# Patient Record
Sex: Female | Born: 1965 | Race: Black or African American | Hispanic: No | Marital: Single
Health system: Midwestern US, Academic
[De-identification: ages and names within clinical notes are randomized; demographics above are authoritative.]

---

## 2017-10-28 ENCOUNTER — Encounter: Admit: 2017-10-28 | Discharge: 2017-10-29

## 2017-11-12 ENCOUNTER — Encounter: Admit: 2017-11-12 | Discharge: 2017-11-12

## 2017-11-18 ENCOUNTER — Encounter: Admit: 2017-11-18 | Discharge: 2017-11-18

## 2017-11-24 ENCOUNTER — Encounter: Admit: 2017-11-24 | Discharge: 2017-11-24

## 2017-11-30 ENCOUNTER — Encounter: Admit: 2017-11-30 | Discharge: 2017-11-30

## 2017-12-02 ENCOUNTER — Encounter: Admit: 2017-12-02 | Discharge: 2017-12-02

## 2017-12-16 ENCOUNTER — Encounter: Admit: 2017-12-16 | Discharge: 2017-12-16

## 2017-12-16 ENCOUNTER — Ambulatory Visit: Admit: 2017-12-16 | Discharge: 2017-12-16 | Payer: Medicaid Other

## 2017-12-16 ENCOUNTER — Ambulatory Visit: Admit: 2017-12-16 | Discharge: 2017-12-17 | Payer: Medicaid Other

## 2017-12-16 DIAGNOSIS — R7989 Other specified abnormal findings of blood chemistry: ICD-10-CM

## 2017-12-16 DIAGNOSIS — K7460 Unspecified cirrhosis of liver: ICD-10-CM

## 2017-12-16 DIAGNOSIS — K760 Fatty (change of) liver, not elsewhere classified: Principal | ICD-10-CM

## 2017-12-16 DIAGNOSIS — R188 Other ascites: ICD-10-CM

## 2017-12-16 LAB — CBC AND DIFF
Lab: 0 % — ABNORMAL LOW (ref >60)
Lab: 0 10*3/uL (ref 0–0.20)
Lab: 0 10*3/uL (ref 0–0.45)
Lab: 0.3 10*3/uL (ref 0–0.80)
Lab: 2.5 M/UL — ABNORMAL LOW (ref 4.0–5.0)
Lab: 22 % — ABNORMAL LOW (ref 24–44)
Lab: 24 % — ABNORMAL LOW (ref 36–45)
Lab: 4.6 10*3/uL (ref 1.8–7.0)
Lab: 6.3 10*3/uL (ref 4.5–11.0)

## 2017-12-16 LAB — HEPATITIS B SURFACE AB

## 2017-12-16 LAB — HEPATITIS C ANTIBODY W REFLEX HCV PCR QUANT: Lab: NEGATIVE

## 2017-12-16 LAB — COMPREHENSIVE METABOLIC PANEL
Lab: 142 MMOL/L (ref 137–147)
Lab: 3.4 MMOL/L — ABNORMAL LOW (ref 3.5–5.1)

## 2017-12-16 LAB — LIPID PROFILE
Lab: 41 mg/dL (ref 7–11)
Lab: 46 mg/dL — ABNORMAL LOW (ref <200)
Lab: 54 mg/dL (ref <150)

## 2017-12-16 LAB — TRANSFERRIN: Lab: <75 mg/dL — ABNORMAL LOW (ref 185–336)

## 2017-12-16 LAB — FOLATE, SERUM: Lab: 21 ng/mL (ref >3.9)

## 2017-12-16 LAB — VITAMIN B12: Lab: 359 pg/mL — ABNORMAL HIGH (ref 180–914)

## 2017-12-16 LAB — ALPHA FETO PROTEIN (AFP): Lab: 1.9 ng/mL — ABNORMAL LOW (ref 0.0–15.0)

## 2017-12-16 LAB — IRON + BINDING CAPACITY + %SAT+ FERRITIN
Lab: 49 ug/dL — ABNORMAL LOW (ref >40)
Lab: 713 ng/mL — ABNORMAL HIGH (ref <100)

## 2017-12-16 LAB — HEPATITIS A IGG: Lab: POSITIVE

## 2017-12-16 LAB — PROTIME INR (PT): Lab: 1.7 — ABNORMAL HIGH (ref 0.8–1.2)

## 2017-12-16 LAB — HEPATITIS B SURFACE AG: Lab: NEGATIVE

## 2017-12-16 MED ORDER — RIFAXIMIN 550 MG PO TAB
550 mg | ORAL_TABLET | Freq: Two times a day (BID) | ORAL | 11 refills | Status: SS
Start: 2017-12-16 — End: 2017-12-17

## 2017-12-16 MED FILL — RIFAXIMIN 550 MG PO TAB: 550 mg | ORAL | 30 days supply | Qty: 60 | Fill #1 | Status: AC

## 2017-12-17 ENCOUNTER — Encounter: Admit: 2017-12-17 | Discharge: 2017-12-17

## 2017-12-17 ENCOUNTER — Emergency Department: Admit: 2017-12-17 | Discharge: 2017-12-17

## 2017-12-17 DIAGNOSIS — K760 Fatty (change of) liver, not elsewhere classified: ICD-10-CM

## 2017-12-17 DIAGNOSIS — E669 Obesity, unspecified: ICD-10-CM

## 2017-12-17 DIAGNOSIS — E8770 Fluid overload, unspecified: Secondary | ICD-10-CM

## 2017-12-17 DIAGNOSIS — E43 Unspecified severe protein-calorie malnutrition: Secondary | ICD-10-CM

## 2017-12-17 DIAGNOSIS — K7460 Unspecified cirrhosis of liver: ICD-10-CM

## 2017-12-17 DIAGNOSIS — K7290 Hepatic failure, unspecified without coma: ICD-10-CM

## 2017-12-17 DIAGNOSIS — M1990 Unspecified osteoarthritis, unspecified site: Principal | ICD-10-CM

## 2017-12-17 DIAGNOSIS — R188 Other ascites: ICD-10-CM

## 2017-12-17 DIAGNOSIS — R601 Generalized edema: ICD-10-CM

## 2017-12-17 DIAGNOSIS — E46 Unspecified protein-calorie malnutrition: ICD-10-CM

## 2017-12-17 DIAGNOSIS — R768 Other specified abnormal immunological findings in serum: ICD-10-CM

## 2017-12-17 LAB — ANTI-NUCLEAR ANTIBODY(ANA)

## 2017-12-17 LAB — PROTIME INR (PT): Lab: 1.7 — ABNORMAL HIGH (ref 0.8–1.2)

## 2017-12-17 LAB — URINALYSIS DIPSTICK
Lab: NEGATIVE 10*3/uL (ref 3–12)
Lab: NEGATIVE MMOL/L (ref 21–30)

## 2017-12-17 LAB — ANTI-NUCLEAR AB(ANA)-QUANT

## 2017-12-17 LAB — CBC AND DIFF
Lab: 0 10*3/uL (ref 0–0.20)
Lab: 0 10*3/uL (ref 0–0.45)
Lab: 5.6 10*3/uL (ref 4.5–11.0)

## 2017-12-17 LAB — COMPREHENSIVE METABOLIC PANEL
Lab: 141 MMOL/L — ABNORMAL LOW (ref 137–147)
Lab: 20 mL/min — ABNORMAL LOW (ref >60)
Lab: 25 mL/min — ABNORMAL LOW (ref >60)

## 2017-12-17 LAB — HEMOGLOBIN A1C: Lab: 4.1 % — ABNORMAL LOW (ref 4.0–6.0)

## 2017-12-17 LAB — 25-OH VITAMIN D (D2 + D3): Lab: 16 ng/mL — ABNORMAL LOW (ref 30–80)

## 2017-12-17 LAB — POC GLUCOSE
Lab: 59 mg/dL — ABNORMAL LOW (ref 70–100)
Lab: 63 mg/dL — ABNORMAL LOW (ref 70–100)
Lab: 57 mg/dL — ABNORMAL LOW (ref 70–100)
Lab: 63 mg/dL — ABNORMAL LOW (ref 70–100)

## 2017-12-17 LAB — MAGNESIUM: Lab: 1.3 mg/dL — ABNORMAL LOW (ref 1.6–2.6)

## 2017-12-17 LAB — URINALYSIS, MICROSCOPIC

## 2017-12-17 LAB — ALPHA-1-ANTITRYPSIN, TOTAL: Lab: 92 — ABNORMAL LOW

## 2017-12-17 LAB — TSH WITH FREE T4 REFLEX: Lab: 7.1 uU/mL — ABNORMAL HIGH (ref 0.35–5.00)

## 2017-12-17 LAB — LIPASE: Lab: 10 U/L — ABNORMAL LOW (ref 11–82)

## 2017-12-17 LAB — FREE T4-FREE THYROXINE: Lab: 0.8 ng/dL (ref 0.6–1.6)

## 2017-12-17 LAB — ANTI-SMOOTH MUSCLE AB: Lab: <20 {titer} (ref <20)

## 2017-12-17 LAB — POC LACTATE: Lab: 1.6 MMOL/L (ref 0.5–2.0)

## 2017-12-17 LAB — ANTI-MITOCHONDRIAL ANTIBODY: Lab: <20 {titer} — ABNORMAL LOW (ref <20)

## 2017-12-17 MED ORDER — PROMETHAZINE 25 MG/ML IJ SOLN
12.5 mg | Freq: Once | INTRAVENOUS | 0 refills | Status: CP
Start: 2017-12-17 — End: ?
  Administered 2017-12-18: 05:00:00 12.5 mg via INTRAVENOUS

## 2017-12-17 MED ORDER — CEFTRIAXONE INJ 1GM IVP
1 g | Freq: Once | INTRAVENOUS | 0 refills | Status: CP
Start: 2017-12-17 — End: ?
  Administered 2017-12-17: 22:00:00 1 g via INTRAVENOUS

## 2017-12-17 MED ORDER — PROMETHAZINE 25 MG/ML IJ SOLN
12.5 mg | Freq: Once | INTRAVENOUS | 0 refills | Status: CP
Start: 2017-12-17 — End: ?
  Administered 2017-12-17: 20:00:00 12.5 mg via INTRAVENOUS

## 2017-12-17 MED ORDER — DICLOFENAC SODIUM 1 % TP GEL
4 g | Freq: Two times a day (BID) | TOPICAL | 0 refills | Status: DC
Start: 2017-12-17 — End: 2017-12-31
  Administered 2017-12-18 – 2017-12-29 (×4): 4 g via TOPICAL

## 2017-12-17 MED ORDER — DEXTROSE 25 % IN WATER (D25W) IV SYRG
10 mL | Freq: Once | INTRAVENOUS | 0 refills | Status: DC
Start: 2017-12-17 — End: 2017-12-17

## 2017-12-17 MED ORDER — MAGNESIUM OXIDE 400 MG (241.3 MG MAGNESIUM) PO TAB
800 mg | Freq: Two times a day (BID) | ORAL | 0 refills | Status: DC
Start: 2017-12-17 — End: 2017-12-29
  Administered 2017-12-18 – 2017-12-29 (×21): 800 mg via ORAL

## 2017-12-17 MED ORDER — CHOLECALCIFEROL (VITAMIN D3) 1,000 UNIT PO TAB
2000 [IU] | Freq: Every day | ORAL | 0 refills | Status: DC
Start: 2017-12-17 — End: 2017-12-22
  Administered 2017-12-17 – 2017-12-21 (×5): 2000 [IU] via ORAL

## 2017-12-17 MED ORDER — MIDODRINE 5 MG PO TAB
2.5 mg | Freq: Two times a day (BID) | ORAL | 0 refills | Status: DC
Start: 2017-12-17 — End: 2017-12-23
  Administered 2017-12-17 – 2017-12-23 (×10): 2.5 mg via ORAL

## 2017-12-17 MED ORDER — LIDOCAINE 5 % TP PTMD
1 | TOPICAL | 0 refills | Status: DC
Start: 2017-12-17 — End: 2017-12-31
  Administered 2017-12-18 – 2017-12-21 (×4): 1 via TOPICAL

## 2017-12-17 MED ORDER — MAGNESIUM SULFATE IN WATER 4 GRAM/50 ML (8 %) IV PGBK
4 g | Freq: Once | INTRAVENOUS | 0 refills | Status: CP
Start: 2017-12-17 — End: ?
  Administered 2017-12-17: 19:00:00 4 g via INTRAVENOUS

## 2017-12-17 MED ORDER — PROMETHAZINE 25 MG PO TAB
25 mg | ORAL | 0 refills | Status: DC | PRN
Start: 2017-12-17 — End: 2017-12-18
  Administered 2017-12-17 – 2017-12-18 (×3): 25 mg via ORAL

## 2017-12-17 MED ORDER — ONDANSETRON 4 MG PO TBDI
4 mg | ORAL | 0 refills | Status: DC | PRN
Start: 2017-12-17 — End: 2017-12-17

## 2017-12-17 MED ORDER — TRIAMCINOLONE ACETONIDE 0.1 % TP CREA
Freq: Three times a day (TID) | TOPICAL | 0 refills | Status: DC
Start: 2017-12-17 — End: 2017-12-31
  Administered 2017-12-18 – 2017-12-29 (×4): via TOPICAL

## 2017-12-17 MED ORDER — PANTOPRAZOLE 40 MG PO TBEC
40 mg | Freq: Every day | ORAL | 0 refills | Status: DC
Start: 2017-12-17 — End: 2017-12-31
  Administered 2017-12-18 – 2017-12-31 (×13): 40 mg via ORAL

## 2017-12-17 MED ORDER — LACTULOSE 10 GRAM/15 ML PO SOLN
20 g | Freq: Every day | ORAL | 0 refills | Status: CN
Start: 2017-12-17 — End: ?

## 2017-12-17 MED ORDER — MULTIVITAMIN PO TAB
1 | Freq: Every day | ORAL | 0 refills | Status: DC
Start: 2017-12-17 — End: 2017-12-21
  Administered 2017-12-17 – 2017-12-20 (×3): 1 via ORAL

## 2017-12-17 MED ORDER — RIFAXIMIN 550 MG PO TAB
550 mg | Freq: Two times a day (BID) | ORAL | 0 refills | Status: DC
Start: 2017-12-17 — End: 2017-12-20
  Administered 2017-12-18 – 2017-12-20 (×5): 550 mg via ORAL

## 2017-12-17 MED ORDER — ALBUMIN, HUMAN 25 % IV SOLP
25 g | INTRAVENOUS | 0 refills | Status: AC
Start: 2017-12-17 — End: ?
  Administered 2017-12-17 – 2017-12-18 (×3): 25 g via INTRAVENOUS

## 2017-12-17 MED ORDER — VITAMIN B COMPLEX PO TAB
1 | Freq: Every day | ORAL | 0 refills | Status: DC
Start: 2017-12-17 — End: 2017-12-25
  Administered 2017-12-17 – 2017-12-25 (×7): 1 via ORAL

## 2017-12-17 MED ORDER — NYSTATIN 100,000 UNIT/GRAM TP POWD
Freq: Two times a day (BID) | TOPICAL | 0 refills | Status: DC
Start: 2017-12-17 — End: 2017-12-31
  Administered 2017-12-18 – 2017-12-28 (×5): via TOPICAL

## 2017-12-18 ENCOUNTER — Encounter: Admit: 2017-12-18 | Discharge: 2017-12-18

## 2017-12-18 DIAGNOSIS — K7460 Unspecified cirrhosis of liver: Principal | ICD-10-CM

## 2017-12-18 DIAGNOSIS — K7290 Hepatic failure, unspecified without coma: ICD-10-CM

## 2017-12-18 DIAGNOSIS — E43 Unspecified severe protein-calorie malnutrition: ICD-10-CM

## 2017-12-18 LAB — PERITONEAL FLUID TOTAL PROTEIN

## 2017-12-18 LAB — CBC AND DIFF: Lab: 3.9 K/UL — ABNORMAL LOW (ref 4.5–11.0)

## 2017-12-18 LAB — ELECTROPHORESIS-SERUM PROTEIN
Lab: 18 % — ABNORMAL HIGH (ref 9–17)
Lab: 32 % — ABNORMAL LOW (ref 48–68)
Lab: 4.1 % (ref 2–6)
Lab: 5.8 g/dL — ABNORMAL LOW (ref 6.0–8.0)
Lab: 7.7 % (ref 5–15)

## 2017-12-18 LAB — COMPREHENSIVE METABOLIC PANEL: Lab: 143 MMOL/L — ABNORMAL LOW (ref 137–147)

## 2017-12-18 LAB — CBC
Lab: 2.3 M/UL — ABNORMAL LOW (ref 4.0–5.0)
Lab: 22 % — ABNORMAL LOW (ref 36–45)
Lab: 32 pg (ref 26–34)
Lab: 34 g/dL (ref 32.0–36.0)
Lab: 4.9 10*3/uL (ref 4.5–11.0)
Lab: 7.7 g/dL — ABNORMAL LOW (ref 12.0–15.0)
Lab: 94 FL (ref 80–100)

## 2017-12-18 LAB — PERITONEAL FLUID ALBUMIN

## 2017-12-18 LAB — KAPPA/LAMBDA FREE LIGHT CHAINS
Lab: 0.9 % — ABNORMAL HIGH (ref 0.26–1.65)
Lab: 13 mg/dL — ABNORMAL HIGH (ref 0.33–1.94)
Lab: 15 mg/dL — ABNORMAL HIGH (ref 0.57–2.63)
Lab: 14 mg/dL — ABNORMAL HIGH (ref 0.33–1.94)
Lab: 16 mg/dL — ABNORMAL HIGH (ref 0.57–2.63)

## 2017-12-18 LAB — MYELOPEROXIDASE AB

## 2017-12-18 LAB — PHOSPHORUS: Lab: 4 mg/dL (ref 2.0–4.5)

## 2017-12-18 LAB — C3 COMPLEMENT 3
Lab: 37 mg/dL — ABNORMAL LOW (ref 88–200)
Lab: 40 mg/dL — ABNORMAL LOW (ref 88–200)

## 2017-12-18 LAB — IMMUNOFIXATION, SERUM (IFES)

## 2017-12-18 LAB — UREA NITROGEN-URINE RANDOM: Lab: 592 mg/dL — ABNORMAL LOW (ref 40–50)

## 2017-12-18 LAB — OSMOLALITY: Lab: 309 mosm/kg — ABNORMAL HIGH (ref 280–307)

## 2017-12-18 LAB — CREATININE-URINE RANDOM: Lab: 155 mg/dL

## 2017-12-18 LAB — AMMONIA: Lab: 64 umol/L — ABNORMAL HIGH (ref 9–35)

## 2017-12-18 LAB — ANTI-DNA DOUBLE STRAND: Lab: <10 {titer} (ref <10)

## 2017-12-18 LAB — C4 COMPLEMENT 4
Lab: 15 mg/dL (ref 10–49)
Lab: 19 mg/dL (ref 10–49)

## 2017-12-18 LAB — POC GLUCOSE
Lab: 67 mg/dL — ABNORMAL LOW (ref 70–100)
Lab: 66 mg/dL — ABNORMAL LOW (ref 70–100)
Lab: 66 mg/dL — ABNORMAL LOW (ref 70–100)
Lab: 53 mg/dL — ABNORMAL LOW (ref 70–100)
Lab: 62 mg/dL — ABNORMAL LOW (ref 70–100)
Lab: 79 mg/dL (ref 70–100)
Lab: 80 mg/dL (ref 70–100)
Lab: 60 mg/dL — ABNORMAL LOW (ref 70–100)

## 2017-12-18 LAB — PROTEIN/CR RATIO,UR RAN
Lab: 40 mg/dL
Lab: 0.3

## 2017-12-18 LAB — HEMOGLOBIN & HEMATOCRIT
Lab: 17 % — ABNORMAL LOW (ref 36–45)
Lab: 5.8 g/dL — CL (ref 12.0–15.0)

## 2017-12-18 LAB — SODIUM-URINE RANDOM: Lab: 29 MMOL/L

## 2017-12-18 LAB — MAGNESIUM: Lab: 1.8 mg/dL — ABNORMAL LOW (ref 1.6–2.6)

## 2017-12-18 LAB — PROTIME INR (PT): Lab: 1.8 MMOL/L — ABNORMAL HIGH (ref 0.8–1.2)

## 2017-12-18 LAB — OSMOLALITY-URINE RANDOM: Lab: 402 mosm/kg (ref 50–1400)

## 2017-12-18 LAB — PROTEINASE 3 ANTIBODIES

## 2017-12-18 MED ORDER — ALBUMIN, HUMAN 25 % IV SOLP
0 refills | Status: CP
Start: 2017-12-18 — End: ?
  Administered 2017-12-18: 19:00:00 12.5 g via INTRAVENOUS

## 2017-12-18 MED ORDER — ZINC SULFATE 220 (50) MG PO CAP
220 mg | Freq: Every day | ORAL | 0 refills | Status: DC
Start: 2017-12-18 — End: 2017-12-25
  Administered 2017-12-19 – 2017-12-25 (×7): 220 mg via ORAL

## 2017-12-18 MED ORDER — ASCORBIC ACID (VITAMIN C) 500 MG PO TAB
500 mg | Freq: Every day | ORAL | 0 refills | Status: DC
Start: 2017-12-18 — End: 2017-12-21
  Administered 2017-12-19: 14:00:00 500 mg via ORAL

## 2017-12-18 MED ORDER — MULTIVIT-IRON-FA-CALCIUM-MINS 9 MG IRON-400 MCG PO TAB
1 | Freq: Two times a day (BID) | ORAL | 0 refills | Status: DC
Start: 2017-12-18 — End: 2017-12-31
  Administered 2017-12-19 – 2017-12-31 (×24): 1 via ORAL

## 2017-12-18 MED ORDER — VITS A AND D-WHITE PET-LANOLIN TP OINT
TOPICAL | 0 refills | Status: DC | PRN
Start: 2017-12-18 — End: 2017-12-31

## 2017-12-18 MED ORDER — FUROSEMIDE 200 MG IN D5W 100 ML IV DRIP
10 mg/h | INTRAVENOUS | 0 refills | Status: DC
Start: 2017-12-18 — End: 2017-12-23
  Administered 2017-12-18 – 2017-12-23 (×14): 10 mg/h via INTRAVENOUS

## 2017-12-18 MED ORDER — MAGNESIUM SULFATE IN D5W 1 GRAM/100 ML IV PGBK
1 g | INTRAVENOUS | 0 refills | Status: AC
Start: 2017-12-18 — End: ?

## 2017-12-18 MED ORDER — POTASSIUM CHLORIDE 20 MEQ PO TBTQ
40 meq | Freq: Once | ORAL | 0 refills | Status: CP
Start: 2017-12-18 — End: ?
  Administered 2017-12-19: 02:00:00 40 meq via ORAL

## 2017-12-18 MED ORDER — FUROSEMIDE 10 MG/ML IJ SOLN
20 mg | Freq: Once | INTRAVENOUS | 0 refills | Status: AC
Start: 2017-12-18 — End: ?

## 2017-12-18 MED ORDER — PROMETHAZINE 25 MG/ML IJ SOLN
12.5 mg | INTRAVENOUS | 0 refills | Status: DC | PRN
Start: 2017-12-18 — End: 2017-12-24
  Administered 2017-12-19 – 2017-12-24 (×20): 12.5 mg via INTRAVENOUS

## 2017-12-18 MED ORDER — DEXTROSE 10 % IN WATER (D10W) 10 % IV SOLP
500 mL | Freq: Once | INTRAVENOUS | 0 refills | Status: CP
Start: 2017-12-18 — End: ?
  Administered 2017-12-19: 02:00:00 500 mL via INTRAVENOUS

## 2017-12-18 MED ORDER — MULTIVIT-IRON-FA-CALCIUM-MINS 9 MG IRON-400 MCG PO TAB
1 | Freq: Every day | ORAL | 0 refills | Status: DC
Start: 2017-12-18 — End: 2017-12-18

## 2017-12-18 MED ADMIN — DEXTROSE 50 % IN WATER (D50W) IV SOLP [86270]: 25 mL | INTRAVENOUS | @ 12:00:00 | Stop: 2017-12-18 | NDC 00409664802

## 2017-12-19 LAB — RETICULOCYTE COUNT
Lab: 1.2 %
Lab: 2 % (ref 0.5–2.0)
Lab: 55 10*3/uL (ref 30–94)

## 2017-12-19 LAB — CULTURE-URINE W/SENSITIVITY: Lab: <10

## 2017-12-19 LAB — POC GLUCOSE
Lab: 82 mg/dL (ref 70–100)
Lab: 78 mg/dL (ref 70–100)
Lab: 102 mg/dL — ABNORMAL HIGH (ref 70–100)
Lab: 91 mg/dL (ref 70–100)

## 2017-12-19 LAB — MAGNESIUM: Lab: 1.6 mg/dL — ABNORMAL HIGH (ref >60)

## 2017-12-19 LAB — PHOSPHORUS: Lab: 3.7 mg/dL — ABNORMAL LOW (ref >60)

## 2017-12-19 LAB — PROTIME INR (PT): Lab: 1.8 M/UL — ABNORMAL HIGH (ref 0.8–1.2)

## 2017-12-19 LAB — COMPREHENSIVE METABOLIC PANEL: Lab: 143 MMOL/L — ABNORMAL LOW (ref 137–147)

## 2017-12-19 LAB — CBC AND DIFF
Lab: 2.7 M/UL — ABNORMAL LOW (ref >60)
Lab: 4.8 K/UL — ABNORMAL HIGH (ref >60)

## 2017-12-19 LAB — GRAM STAIN

## 2017-12-19 MED ORDER — ALBUMIN, HUMAN 25 % IV SOLP
25 g | INTRAVENOUS | 0 refills | Status: DC
Start: 2017-12-19 — End: 2017-12-21
  Administered 2017-12-19 – 2017-12-21 (×9): 25 g via INTRAVENOUS

## 2017-12-20 ENCOUNTER — Encounter: Admit: 2017-12-20 | Discharge: 2017-12-20

## 2017-12-20 LAB — BASIC METABOLIC PANEL
Lab: 111 MMOL/L — ABNORMAL HIGH (ref 98–110)
Lab: 143 MMOL/L (ref 137–147)
Lab: 2.3 mg/dL — ABNORMAL HIGH (ref 0.4–1.00)
Lab: 22 mL/min — ABNORMAL LOW (ref >60)
Lab: 23 MMOL/L (ref 21–30)
Lab: 27 mL/min — ABNORMAL LOW (ref >60)
Lab: 27 mg/dL — ABNORMAL HIGH (ref 7–25)
Lab: 3.3 MMOL/L — ABNORMAL LOW (ref 3.5–5.1)
Lab: 7.4 mg/dL — ABNORMAL LOW (ref 8.5–10.6)
Lab: 97 mg/dL (ref 70–100)
Lab: 9 (ref 3–12)

## 2017-12-20 LAB — URINALYSIS DIPSTICK
Lab: NEGATIVE
Lab: NEGATIVE
Lab: NEGATIVE
Lab: NEGATIVE
Lab: NEGATIVE
Lab: NEGATIVE

## 2017-12-20 LAB — URINALYSIS, MICROSCOPIC

## 2017-12-20 LAB — POC GLUCOSE
Lab: 76 mg/dL (ref 70–100)
Lab: 86 mg/dL (ref 70–100)
Lab: 56 mg/dL — ABNORMAL LOW (ref 70–100)
Lab: 45 mg/dL — CL (ref 70–100)
Lab: 65 mg/dL — ABNORMAL LOW (ref 70–100)
Lab: 84 mg/dL (ref 70–100)
Lab: 93 mg/dL (ref 70–100)
Lab: 93 mg/dL (ref 70–100)
Lab: 75 mg/dL (ref 70–100)

## 2017-12-20 LAB — PROTIME INR (PT): Lab: 1.9 g/dL — ABNORMAL HIGH (ref 0.8–1.2)

## 2017-12-20 LAB — VITAMIN A

## 2017-12-20 LAB — CBC AND DIFF
Lab: 2.6 M/UL — ABNORMAL LOW (ref 4.0–5.0)
Lab: 3.8 K/UL — ABNORMAL LOW (ref 4.5–11.0)

## 2017-12-20 LAB — MAGNESIUM: Lab: 1.6 mg/dL — ABNORMAL LOW (ref >60)

## 2017-12-20 LAB — PHOSPHORUS: Lab: 3.3 mg/dL — ABNORMAL LOW (ref 2.0–4.5)

## 2017-12-20 LAB — COMPREHENSIVE METABOLIC PANEL: Lab: 144 MMOL/L — ABNORMAL LOW (ref >60)

## 2017-12-20 MED ORDER — POTASSIUM CHLORIDE 20 MEQ PO TBTQ
40 meq | Freq: Once | ORAL | 0 refills | Status: AC
Start: 2017-12-20 — End: ?

## 2017-12-20 MED ORDER — PHYTONADIONE IVPB
10 mg | Freq: Three times a day (TID) | INTRAVENOUS | 0 refills | Status: DC
Start: 2017-12-20 — End: 2017-12-20

## 2017-12-20 MED ORDER — SODIUM CHLORIDE 0.9 % FLUSH
5-10 mL | Freq: Three times a day (TID) | INTRAVENOUS | 0 refills | Status: DC
Start: 2017-12-20 — End: 2017-12-31

## 2017-12-20 MED ORDER — MAGNESIUM SULFATE IN D5W 1 GRAM/100 ML IV PGBK
1 g | INTRAVENOUS | 0 refills | Status: CP
Start: 2017-12-20 — End: ?
  Administered 2017-12-21 (×2): 1 g via INTRAVENOUS

## 2017-12-20 MED ORDER — PHYTONADIONE IVPB
10 mg | Freq: Every day | INTRAVENOUS | 0 refills | Status: CP
Start: 2017-12-20 — End: ?
  Administered 2017-12-20 – 2017-12-22 (×6): 10 mg via INTRAVENOUS

## 2017-12-20 MED ORDER — POTASSIUM CHLORIDE IN WATER 10 MEQ/50 ML IV PGBK
10 meq | INTRAVENOUS | 0 refills | Status: DC
Start: 2017-12-20 — End: 2017-12-20

## 2017-12-20 MED ORDER — POTASSIUM CHLORIDE 20 MEQ PO TBTQ
40 meq | Freq: Once | ORAL | 0 refills | Status: CP
Start: 2017-12-20 — End: ?
  Administered 2017-12-20: 15:00:00 40 meq via ORAL

## 2017-12-20 MED ORDER — MAGNESIUM SULFATE IN D5W 1 GRAM/100 ML IV PGBK
1 g | INTRAVENOUS | 0 refills | Status: AC
Start: 2017-12-20 — End: ?

## 2017-12-20 MED ORDER — VITAMIN A 10,000 UNIT PO CAP
10000 [IU] | Freq: Every day | ORAL | 0 refills | Status: DC
Start: 2017-12-20 — End: 2017-12-25
  Administered 2017-12-20 – 2017-12-25 (×5): 10000 [IU] via ORAL

## 2017-12-20 MED ORDER — POTASSIUM CHLORIDE IN WATER 10 MEQ/50 ML IV PGBK
10 meq | INTRAVENOUS | 0 refills | Status: CP
Start: 2017-12-20 — End: ?
  Administered 2017-12-20 – 2017-12-21 (×4): 10 meq via INTRAVENOUS

## 2017-12-20 MED ORDER — DEXTROSE 10 % IN WATER (D10W) 10 % IV SOLP
INTRAVENOUS | 0 refills | Status: DC
Start: 2017-12-20 — End: 2017-12-22
  Administered 2017-12-20 – 2017-12-21 (×2): 1000.000 mL via INTRAVENOUS

## 2017-12-20 MED ADMIN — DEXTROSE 50 % IN WATER (D50W) IV SYRG [2365]: 25 mL | INTRAVENOUS | @ 16:00:00 | Stop: 2017-12-20 | NDC 00409751716

## 2017-12-21 ENCOUNTER — Encounter: Admit: 2017-12-21 | Discharge: 2017-12-21

## 2017-12-21 DIAGNOSIS — E43 Unspecified severe protein-calorie malnutrition: ICD-10-CM

## 2017-12-21 DIAGNOSIS — K7460 Unspecified cirrhosis of liver: Principal | ICD-10-CM

## 2017-12-21 DIAGNOSIS — K7290 Hepatic failure, unspecified without coma: ICD-10-CM

## 2017-12-21 LAB — IMMUNOFIXATION, SERUM (IFES)

## 2017-12-21 LAB — URINALYSIS DIPSTICK REFLEX TO CULTURE
Lab: 1 mmol/L — AB
Lab: NEGATIVE — ABNORMAL LOW (ref 3.5–5.0)

## 2017-12-21 LAB — BASIC METABOLIC PANEL
Lab: 1.8 mg/dL — ABNORMAL HIGH (ref 0.4–1.00)
Lab: 107 mg/dL — ABNORMAL HIGH (ref 70–100)
Lab: 110 MMOL/L (ref 98–110)
Lab: 142 MMOL/L (ref 137–147)
Lab: 23 mg/dL (ref 7–25)
Lab: 28 MMOL/L (ref 21–30)
Lab: 29 mL/min — ABNORMAL LOW (ref >60)
Lab: 3.5 MMOL/L (ref 3.5–5.1)
Lab: 35 mL/min — ABNORMAL LOW (ref >60)
Lab: 4 (ref 3–12)
Lab: 7.1 mg/dL — ABNORMAL LOW (ref 8.5–10.6)

## 2017-12-21 LAB — CBC AND DIFF
Lab: 0 10*3/uL (ref 0–0.45)
Lab: 4.3 10*3/uL — ABNORMAL LOW (ref 4.5–11.0)
Lab: 8.1

## 2017-12-21 LAB — PHOSPHORUS
Lab: 2.7 mg/dL — ABNORMAL LOW (ref 2.0–4.5)
Lab: 2.5 mg/dL (ref 2.0–4.5)

## 2017-12-21 LAB — COMPREHENSIVE METABOLIC PANEL
Lab: 144 MMOL/L — ABNORMAL LOW (ref 137–147)
Lab: 26 mL/min — ABNORMAL LOW (ref >60)
Lab: 31 mL/min — ABNORMAL LOW (ref >60)
Lab: 6 10*3/uL (ref 3–12)
Lab: 9 U/L (ref 7–56)
Lab: 115 — ABNORMAL HIGH (ref 98–107)
Lab: 12 — ABNORMAL LOW (ref 22–29)
Lab: 77 — ABNORMAL LOW (ref 4.20–5.40)

## 2017-12-21 LAB — ELECTROPHORESIS-SERUM PROTEIN
Lab: 32 % — ABNORMAL HIGH (ref 9–21)
Lab: 5.6 g/dL — ABNORMAL LOW (ref 6.0–8.0)

## 2017-12-21 LAB — POC GLUCOSE
Lab: 112 mg/dL — ABNORMAL HIGH (ref 70–100)
Lab: 76 mg/dL (ref 70–100)
Lab: 113 mg/dL — ABNORMAL HIGH (ref 70–100)
Lab: 55 mg/dL — ABNORMAL LOW (ref 70–100)
Lab: 76 mg/dL — ABNORMAL LOW (ref 70–100)

## 2017-12-21 LAB — URINALYSIS MICROSCOPIC REFLEX TO CULTURE: Lab: >10 — ABNORMAL LOW

## 2017-12-21 LAB — MAGNESIUM
Lab: 1.7 mg/dL — ABNORMAL LOW (ref 1.6–2.6)
Lab: 1.7 mg/dL (ref 1.6–2.6)

## 2017-12-21 LAB — CELL COUNT W/DIFF-FLUIDS: Lab: 190 /uL

## 2017-12-21 LAB — PROTIME INR (PT): Lab: 1.8 — ABNORMAL HIGH (ref 0.8–1.2)

## 2017-12-21 MED ORDER — MIDAZOLAM 1 MG/ML IJ SOLN
0 refills | Status: CP
Start: 2017-12-21 — End: ?
  Administered 2017-12-21 (×3): 1 mg via INTRAVENOUS

## 2017-12-21 MED ORDER — FENTANYL CITRATE (PF) 50 MCG/ML IJ SOLN
50 ug | Freq: Once | INTRAVENOUS | 0 refills | Status: CP
Start: 2017-12-21 — End: ?
  Administered 2017-12-21: 14:00:00 50 ug via INTRAVENOUS

## 2017-12-21 MED ORDER — IOPAMIDOL 61 % IV SOLN
80 mL | Freq: Once | INTRAVENOUS | 0 refills | Status: CP
Start: 2017-12-21 — End: ?
  Administered 2017-12-21: 15:00:00 80 mL via INTRAVENOUS

## 2017-12-21 MED ORDER — TRAMADOL 50 MG PO TAB
50 mg | Freq: Once | ORAL | 0 refills | Status: CP
Start: 2017-12-21 — End: ?
  Administered 2017-12-22: 04:00:00 50 mg via ORAL

## 2017-12-21 MED ORDER — FENTANYL CITRATE (PF) 50 MCG/ML IJ SOLN
0 refills | Status: CP
Start: 2017-12-21 — End: ?
  Administered 2017-12-21 (×2): 50 ug via INTRAVENOUS

## 2017-12-21 MED ORDER — COSYNTROPIN 0.25 MG IJ SOLR
.25 mg | Freq: Once | INTRAVENOUS | 0 refills | Status: CP
Start: 2017-12-21 — End: ?
  Administered 2017-12-22: 13:00:00 0.25 mg via INTRAVENOUS

## 2017-12-21 MED ORDER — MAGNESIUM SULFATE IN D5W 1 GRAM/100 ML IV PGBK
1 g | INTRAVENOUS | 0 refills | Status: AC
Start: 2017-12-21 — End: ?

## 2017-12-21 MED ORDER — LIDOCAINE 5 % TP PTMD
2 | Freq: Every day | TOPICAL | 0 refills | Status: DC
Start: 2017-12-21 — End: 2017-12-22

## 2017-12-21 MED ORDER — POTASSIUM CHLORIDE 20 MEQ/15 ML PO LIQD
40 meq | Freq: Once | ORAL | 0 refills | Status: CP
Start: 2017-12-21 — End: ?
  Administered 2017-12-21: 17:00:00 40 meq via ORAL

## 2017-12-21 MED ORDER — ERGOCALCIFEROL (VITAMIN D2) 50,000 UNIT PO CAP
50000 [IU] | ORAL | 0 refills | Status: DC
Start: 2017-12-21 — End: 2017-12-26
  Administered 2017-12-23 – 2017-12-25 (×2): 50000 [IU] via ORAL

## 2017-12-21 MED ORDER — ALBUMIN, HUMAN 25 % IV SOLP
25 g | Freq: Two times a day (BID) | INTRAVENOUS | 0 refills | Status: DC
Start: 2017-12-21 — End: 2017-12-24
  Administered 2017-12-22 – 2017-12-24 (×5): 25 g via INTRAVENOUS

## 2017-12-21 MED ORDER — DEXTROSE 50 % IN WATER (D50W) IV SOLP
50 mL | Freq: Once | INTRAVENOUS | 0 refills | Status: AC
Start: 2017-12-21 — End: ?

## 2017-12-21 MED ORDER — LIDOCAINE 5 % TP PTMD
1 | Freq: Every day | TOPICAL | 0 refills | Status: DC
Start: 2017-12-21 — End: 2017-12-31

## 2017-12-21 MED ORDER — MIDAZOLAM 1 MG/ML IJ SOLN
1 mg | Freq: Once | INTRAVENOUS | 0 refills | Status: CP
Start: 2017-12-21 — End: ?
  Administered 2017-12-21: 14:00:00 1 mg via INTRAVENOUS

## 2017-12-22 ENCOUNTER — Encounter: Admit: 2017-12-22 | Discharge: 2017-12-22

## 2017-12-22 LAB — POC GLUCOSE
Lab: 92 mg/dL (ref 70–100)
Lab: 86 mg/dL (ref 70–100)
Lab: 90 mg/dL (ref 70–100)
Lab: 83 mg/dL (ref 70–100)
Lab: 80 mg/dL (ref 70–100)
Lab: 69 mg/dL — ABNORMAL LOW (ref 70–100)
Lab: 59 mg/dL — ABNORMAL LOW (ref 70–100)
Lab: 67 mg/dL — ABNORMAL LOW (ref 70–100)
Lab: 60 mg/dL — ABNORMAL LOW (ref 70–100)
Lab: 60 mg/dL — ABNORMAL LOW (ref 70–100)
Lab: 71 mg/dL (ref 70–100)

## 2017-12-22 LAB — PHOSPHORUS
Lab: 2.7 mg/dL — ABNORMAL LOW (ref >60)
Lab: 2.7 mg/dL — ABNORMAL HIGH (ref 2.0–4.5)

## 2017-12-22 LAB — BASIC METABOLIC PANEL
Lab: 1.9 mg/dL — ABNORMAL HIGH (ref 0.4–1.00)
Lab: 106 MMOL/L (ref 98–110)
Lab: 144 MMOL/L (ref 137–147)
Lab: 24 mg/dL (ref 7–25)
Lab: 26 mL/min — ABNORMAL LOW (ref >60)
Lab: 32 mL/min — ABNORMAL LOW (ref >60)
Lab: 4 (ref 3–12)
Lab: 7.6 mg/dL — ABNORMAL LOW (ref 8.5–10.6)
Lab: 77 mg/dL (ref 70–100)

## 2017-12-22 LAB — PARATHYROID HORMONE: Lab: 141 pg/mL — ABNORMAL HIGH (ref 10–65)

## 2017-12-22 LAB — CBC AND DIFF: Lab: 3.8 K/UL — ABNORMAL LOW (ref >60)

## 2017-12-22 LAB — PROTIME INR (PT): Lab: 1.8 MMOL/L — ABNORMAL HIGH (ref 0.8–1.2)

## 2017-12-22 LAB — COPPER: Lab: 0.4 — ABNORMAL LOW

## 2017-12-22 LAB — SELENIUM: Lab: 46 g/dL — ABNORMAL LOW (ref 6.0–8.0)

## 2017-12-22 LAB — MANGANESE, S

## 2017-12-22 LAB — ZINC: Lab: 0.4 U/L — ABNORMAL LOW (ref 25–110)

## 2017-12-22 LAB — CORTISOL 60 MINUTES POST: Lab: 21 ug/dL

## 2017-12-22 LAB — COMPREHENSIVE METABOLIC PANEL: Lab: 143 MMOL/L — ABNORMAL LOW (ref 137–147)

## 2017-12-22 LAB — MAGNESIUM
Lab: 1.8 mg/dL — ABNORMAL LOW (ref 1.6–2.6)
Lab: 1.8 mg/dL (ref 1.6–2.6)

## 2017-12-22 LAB — CORTISOL BASELINE: Lab: 11 ug/dL

## 2017-12-22 LAB — CORTISOL 30 MINUTES POST: Lab: 17 ug/dL

## 2017-12-22 MED ORDER — LIDOCAINE 5 % TP PTMD
1 | Freq: Every day | TOPICAL | 0 refills | Status: DC
Start: 2017-12-22 — End: 2017-12-31
  Administered 2017-12-22 – 2017-12-31 (×10): 1 via TOPICAL

## 2017-12-22 MED ORDER — POTASSIUM CHLORIDE 20 MEQ/15 ML PO LIQD
40 meq | Freq: Once | ORAL | 0 refills | Status: DC
Start: 2017-12-22 — End: 2017-12-23

## 2017-12-22 MED ORDER — MAGNESIUM SULFATE IN D5W 1 GRAM/100 ML IV PGBK
1 g | INTRAVENOUS | 0 refills | Status: CP
Start: 2017-12-22 — End: ?
  Administered 2017-12-22 (×2): 1 g via INTRAVENOUS

## 2017-12-23 LAB — BASIC METABOLIC PANEL
Lab: 1.7 mg/dL — ABNORMAL HIGH (ref 0.4–1.00)
Lab: 104 MMOL/L (ref 98–110)
Lab: 143 MMOL/L (ref 137–147)
Lab: 23 mg/dL (ref 7–25)
Lab: 30 mL/min — ABNORMAL LOW (ref >60)
Lab: 35 MMOL/L — ABNORMAL HIGH (ref 21–30)
Lab: 36 mL/min — ABNORMAL LOW (ref >60)
Lab: 57 mg/dL — ABNORMAL LOW (ref 70–100)
Lab: 7.7 mg/dL — ABNORMAL LOW (ref 8.5–10.6)
Lab: 4 (ref 3–12)
Lab: 1.7 mg/dL — ABNORMAL HIGH (ref 0.50–0.99)
Lab: 103 MMOL/L (ref 135–146)
Lab: 141 MMOL/L (ref 137–147)
Lab: 23 mg/dL (ref 7–25)
Lab: 3.9 MMOL/L (ref 3.5–5.1)
Lab: 30 mL/min — ABNORMAL LOW (ref >60)
Lab: 33 MMOL/L — ABNORMAL HIGH (ref 3.5–5.3)
Lab: 37 mL/min — ABNORMAL LOW (ref >60)
Lab: 8.1 mg/dL — ABNORMAL LOW (ref 65–139)
Lab: 89 mg/dL (ref 20–32)
Lab: 5 (ref 98–110)

## 2017-12-23 LAB — CULTURE-WOUND/TISSUE/FLUID(AEROBIC ONLY)W/SENSITIVITY

## 2017-12-23 LAB — POC GLUCOSE
Lab: 74 mg/dL (ref 70–100)
Lab: 62 mg/dL — ABNORMAL LOW (ref 70–100)
Lab: 68 mg/dL — ABNORMAL LOW (ref 70–100)
Lab: 111 mg/dL — ABNORMAL HIGH (ref 70–100)
Lab: 72 mg/dL — ABNORMAL LOW (ref >60)
Lab: 62 mg/dL — ABNORMAL LOW (ref 70–100)
Lab: 49 mg/dL — CL (ref 70–100)
Lab: 53 mg/dL — ABNORMAL LOW (ref 70–100)
Lab: 93 mg/dL (ref 70–100)
Lab: 97 mg/dL (ref 70–100)
Lab: 73 mg/dL (ref 70–100)
Lab: 77 mg/dL (ref 70–100)
Lab: 90 mg/dL (ref 70–100)

## 2017-12-23 LAB — COMPREHENSIVE METABOLIC PANEL
Lab: 141 MMOL/L — ABNORMAL LOW (ref 137–147)
Lab: 56 mg/dL — ABNORMAL LOW (ref >60)

## 2017-12-23 LAB — IMMATURE PLATELET FRACTION: Lab: 7.4 % — ABNORMAL HIGH (ref 1.1–7.1)

## 2017-12-23 LAB — 25-OH VITAMIN D (D2 + D3): Lab: 11 ng/mL — ABNORMAL LOW (ref 30–80)

## 2017-12-23 LAB — CBC AND DIFF: Lab: 3.8 K/UL — ABNORMAL LOW (ref >60)

## 2017-12-23 LAB — PHOSPHORUS
Lab: 2.9 mg/dL — ABNORMAL HIGH (ref >60)
Lab: 2.9 mg/dL (ref 2.0–4.5)

## 2017-12-23 LAB — MAGNESIUM
Lab: 2 mg/dL — ABNORMAL LOW (ref 1.6–2.6)
Lab: 2.1 mg/dL (ref 1.6–2.6)

## 2017-12-23 LAB — PROTIME INR (PT): Lab: 1.9 M/UL — ABNORMAL HIGH (ref 0.8–1.2)

## 2017-12-23 LAB — CHROMIUM-BLOOD: Lab: 0.1 mg/dL (ref 70–100)

## 2017-12-23 LAB — COPPER: Lab: 0.3 — ABNORMAL LOW

## 2017-12-23 LAB — BETA HYDROXYBUTYRATE (KETONES): Lab: 0.6 MMOL/L — ABNORMAL HIGH (ref <0.3)

## 2017-12-23 LAB — INSULIN (IRI): Lab: 1.1 uU/mL — ABNORMAL LOW (ref 2.0–23.0)

## 2017-12-23 LAB — ACTH: Lab: 30

## 2017-12-23 LAB — ZINC: Lab: 0.3 — ABNORMAL LOW

## 2017-12-23 MED ORDER — LIPASE-PROTEASE-AMYLASE (CREON 24,000) 24,000-76,000- 120,000 UNIT PO CPDR
1 | Freq: Three times a day (TID) | ORAL | 0 refills | Status: DC
Start: 2017-12-23 — End: 2017-12-25
  Administered 2017-12-23 – 2017-12-25 (×7): 1 via ORAL

## 2017-12-23 MED ORDER — THIAMINE HCL (VITAMIN B1) 100 MG/ML IJ SOLN
100 mg | Freq: Every day | INTRAVENOUS | 0 refills | Status: DC
Start: 2017-12-23 — End: 2017-12-24
  Administered 2017-12-23 – 2017-12-24 (×2): 100 mg via INTRAVENOUS

## 2017-12-23 MED ORDER — POTASSIUM CHLORIDE 20 MEQ/15 ML PO LIQD
40 meq | Freq: Once | ORAL | 0 refills | Status: DC
Start: 2017-12-23 — End: 2017-12-23

## 2017-12-23 MED ORDER — MIDODRINE 5 MG PO TAB
10 mg | Freq: Two times a day (BID) | ORAL | 0 refills | Status: DC
Start: 2017-12-23 — End: 2017-12-31
  Administered 2017-12-23 – 2017-12-31 (×16): 10 mg via ORAL

## 2017-12-23 MED ORDER — FUROSEMIDE 20 MG PO TAB
60 mg | Freq: Every day | ORAL | 0 refills | Status: DC
Start: 2017-12-23 — End: 2017-12-31
  Administered 2017-12-23 – 2017-12-31 (×9): 60 mg via ORAL

## 2017-12-23 MED ORDER — POTASSIUM CHLORIDE 20 MEQ PO TBTQ
40 meq | Freq: Once | ORAL | 0 refills | Status: CP
Start: 2017-12-23 — End: ?
  Administered 2017-12-23: 13:00:00 40 meq via ORAL

## 2017-12-24 LAB — VITAMIN K: Lab: 11 g/dL — ABNORMAL HIGH (ref 3.5–5.0)

## 2017-12-24 LAB — POC GLUCOSE
Lab: 87 mg/dL (ref 70–100)
Lab: 85 mg/dL (ref 70–100)
Lab: 107 mg/dL — ABNORMAL HIGH (ref 70–100)
Lab: 86 mg/dL (ref 70–100)
Lab: 75 mg/dL (ref 70–100)
Lab: 68 mg/dL — ABNORMAL LOW (ref 70–100)
Lab: 80 mg/dL (ref 70–100)
Lab: 103 mg/dL — ABNORMAL HIGH (ref 70–100)
Lab: 101 mg/dL — ABNORMAL HIGH (ref 70–100)
Lab: 96 mg/dL (ref 70–100)
Lab: 79 mg/dL (ref 70–100)
Lab: 78 mg/dL — ABNORMAL HIGH (ref 70–100)
Lab: 80 mg/dL (ref 70–100)

## 2017-12-24 LAB — VITAMIN C (ASCORBIC ACID): Lab: 0.4 U/L (ref 7–40)

## 2017-12-24 LAB — MAGNESIUM
Lab: 2 mg/dL — ABNORMAL LOW (ref 1.6–2.6)
Lab: 2 mg/dL (ref 1.6–2.6)

## 2017-12-24 LAB — PHOSPHORUS
Lab: 2.6 mg/dL — ABNORMAL HIGH (ref 2.0–4.5)
Lab: 2.6 mg/dL (ref 2.0–4.5)

## 2017-12-24 LAB — BASIC METABOLIC PANEL
Lab: 1.8 mg/dL — ABNORMAL HIGH (ref 0.4–1.00)
Lab: 102 mg/dL — ABNORMAL HIGH (ref 70–100)
Lab: 104 MMOL/L (ref 98–110)
Lab: 143 MMOL/L (ref 137–147)
Lab: 22 mg/dL (ref 7–25)
Lab: 28 mL/min — ABNORMAL LOW (ref >60)
Lab: 3.9 MMOL/L (ref 3.5–5.1)
Lab: 34 mL/min — ABNORMAL LOW (ref >60)
Lab: 7.9 mg/dL — ABNORMAL LOW (ref 8.5–10.6)
Lab: 142 MMOL/L (ref >60)
Lab: 36 MMOL/L — ABNORMAL HIGH (ref 21–30)

## 2017-12-24 LAB — CBC AND DIFF: Lab: 4.1 10*3/uL — ABNORMAL LOW (ref 4.5–11.0)

## 2017-12-24 LAB — C-PEPTIDE: Lab: 1.3

## 2017-12-24 LAB — COMPREHENSIVE METABOLIC PANEL: Lab: 144 MMOL/L — ABNORMAL LOW (ref >60)

## 2017-12-24 LAB — PROTIME INR (PT): Lab: 1.7 mg/dL — ABNORMAL HIGH (ref 0.8–1.2)

## 2017-12-24 LAB — VITAMIN E: Lab: 3.8 mg/dL — ABNORMAL LOW (ref 0.3–1.2)

## 2017-12-24 LAB — VITAMIN B1 (THIAMINE) WHOLE BLD: Lab: 79 MMOL/L (ref 21–30)

## 2017-12-24 MED ORDER — PROMETHAZINE 25 MG PO TAB
25 mg | ORAL | 0 refills | Status: DC | PRN
Start: 2017-12-24 — End: 2017-12-31
  Administered 2017-12-24 – 2017-12-31 (×18): 25 mg via ORAL

## 2017-12-24 MED ORDER — THIAMINE MONONITRATE (VIT B1) 100 MG PO TAB
100 mg | Freq: Every day | ORAL | 0 refills | Status: DC
Start: 2017-12-24 — End: 2017-12-31
  Administered 2017-12-25 – 2017-12-31 (×7): 100 mg via ORAL

## 2017-12-24 MED ORDER — VITAMIN A 10,000 UNIT PO CAP
10000 [IU] | ORAL_CAPSULE | Freq: Every day | ORAL | 1 refills | Status: CN
Start: 2017-12-24 — End: ?

## 2017-12-24 MED ORDER — FENTANYL CITRATE (PF) 50 MCG/ML IJ SOLN
25 ug | Freq: Once | INTRAVENOUS | 0 refills | Status: CP
Start: 2017-12-24 — End: ?
  Administered 2017-12-24: 22:00:00 25 ug via INTRAVENOUS

## 2017-12-24 MED ORDER — TRAMADOL 50 MG PO TAB
50 mg | Freq: Once | ORAL | 0 refills | Status: CP
Start: 2017-12-24 — End: ?
  Administered 2017-12-25: 03:00:00 50 mg via ORAL

## 2017-12-24 MED ORDER — ZINC SULFATE 220 (50) MG PO CAP
220 mg | Freq: Every day | ORAL | 0 refills | Status: CN
Start: 2017-12-24 — End: ?

## 2017-12-24 MED ORDER — FENTANYL CITRATE (PF) 50 MCG/ML IJ SOLN
12.5-25 ug | Freq: Once | INTRAVENOUS | 0 refills | Status: CP
Start: 2017-12-24 — End: ?
  Administered 2017-12-24: 18:00:00 25 ug via INTRAVENOUS

## 2017-12-25 LAB — POC GLUCOSE
Lab: 120 mg/dL — ABNORMAL HIGH (ref 70–100)
Lab: 149 mg/dL — ABNORMAL HIGH (ref 70–100)
Lab: 85 mg/dL (ref 70–100)
Lab: 120 mg/dL — ABNORMAL HIGH (ref 70–100)
Lab: 78 mg/dL (ref 70–100)
Lab: 75 mg/dL (ref 70–100)
Lab: 73 mg/dL (ref 70–100)
Lab: 88 mg/dL (ref 70–100)
Lab: 79 mg/dL (ref 70–100)
Lab: 63 mg/dL — ABNORMAL LOW (ref 70–100)
Lab: 105 mg/dL — ABNORMAL HIGH (ref 70–100)
Lab: 37 mg/dL — CL (ref 70–100)
Lab: 42 mg/dL — CL (ref 70–100)
Lab: 127 mg/dL — ABNORMAL HIGH (ref 70–100)

## 2017-12-25 LAB — PHOSPHORUS: Lab: 2.6 mg/dL — ABNORMAL LOW (ref >60)

## 2017-12-25 LAB — BASIC METABOLIC PANEL
Lab: 140 MMOL/L — ABNORMAL HIGH (ref 137–147)
Lab: 2 mg/dL — ABNORMAL HIGH (ref 0.4–1.00)
Lab: 25 mg/dL (ref 7–25)
Lab: 34 MMOL/L — ABNORMAL HIGH (ref 21–30)
Lab: 4.6 MMOL/L — ABNORMAL HIGH (ref 3.5–5.1)
Lab: 77 mg/dL — ABNORMAL LOW (ref >60)

## 2017-12-25 LAB — CBC AND DIFF: Lab: 5.9 K/UL — ABNORMAL HIGH (ref 4.5–11.0)

## 2017-12-25 LAB — PROTIME INR (PT): Lab: 1.7 mg/dL — ABNORMAL HIGH (ref >60)

## 2017-12-25 LAB — MAGNESIUM
Lab: 2 mg/dL — ABNORMAL LOW (ref >60)
Lab: 2.2 mg/dL — ABNORMAL HIGH (ref 1.6–2.6)

## 2017-12-25 LAB — COMPREHENSIVE METABOLIC PANEL: Lab: 141 MMOL/L — ABNORMAL LOW (ref 137–147)

## 2017-12-25 LAB — VITAMIN A: Lab: 7.1 — ABNORMAL LOW

## 2017-12-25 LAB — VITAMIN B1 (THIAMINE) WHOLE BLD: Lab: 75

## 2017-12-25 MED ORDER — VITAMIN E (DL, ACETATE) 200 UNIT PO CAP
600 [IU] | Freq: Every day | ORAL | 0 refills | Status: CN
Start: 2017-12-25 — End: ?

## 2017-12-25 MED ORDER — VITAMIN A 10,000 UNIT PO CAP
30000 [IU] | ORAL_CAPSULE | Freq: Every day | ORAL | 1 refills | Status: CN
Start: 2017-12-25 — End: ?

## 2017-12-25 MED ORDER — MULTIVITAMIN PO TAB
1 | Freq: Every day | ORAL | 0 refills | Status: DC
Start: 2017-12-25 — End: 2017-12-26

## 2017-12-25 MED ORDER — ZINC SULFATE 220 (50) MG PO CAP
220 mg | ORAL_CAPSULE | Freq: Two times a day (BID) | ORAL | 1 refills | Status: CN
Start: 2017-12-25 — End: ?

## 2017-12-25 MED ORDER — LIPASE-PROTEASE-AMYLASE (CREON 24,000) 24,000-76,000- 120,000 UNIT PO CPDR
3 | Freq: Three times a day (TID) | ORAL | 0 refills | Status: DC
Start: 2017-12-25 — End: 2017-12-28
  Administered 2017-12-26 – 2017-12-27 (×6): 3 via ORAL

## 2017-12-25 MED ORDER — PROCHLORPERAZINE EDISYLATE 5 MG/ML IJ SOLN
10 mg | INTRAVENOUS | 0 refills | Status: DC | PRN
Start: 2017-12-25 — End: 2017-12-26
  Administered 2017-12-26: 07:00:00 10 mg via INTRAVENOUS

## 2017-12-25 MED ORDER — DIPHENHYDRAMINE HCL 50 MG/ML IJ SOLN
25 mg | Freq: Once | INTRAVENOUS | 0 refills | Status: DC
Start: 2017-12-25 — End: 2017-12-25

## 2017-12-25 MED ORDER — LIPASE-PROTEASE-AMYLASE (CREON 24,000) 24,000-76,000- 120,000 UNIT PO CPDR
2 | ORAL | 0 refills | Status: DC
Start: 2017-12-25 — End: 2017-12-28
  Administered 2017-12-26 – 2017-12-28 (×5): 2 via ORAL

## 2017-12-25 MED ORDER — MAGNESIUM SULFATE IN D5W 1 GRAM/100 ML IV PGBK
1 g | INTRAVENOUS | 0 refills | Status: CP
Start: 2017-12-25 — End: ?
  Administered 2017-12-25 – 2017-12-26 (×2): 1 g via INTRAVENOUS

## 2017-12-25 MED ORDER — ERGOCALCIFEROL (VITAMIN D2) 50,000 UNIT PO CAP
50000 [IU] | ORAL | 0 refills | Status: DC
Start: 2017-12-25 — End: 2017-12-31
  Administered 2017-12-28 – 2017-12-30 (×2): 50000 [IU] via ORAL

## 2017-12-25 MED ORDER — DIPHENHYDRAMINE IVPB
25 mg | INTRAVENOUS | 0 refills | Status: CP
Start: 2017-12-25 — End: ?
  Administered 2017-12-25 – 2017-12-26 (×4): 25 mg via INTRAVENOUS

## 2017-12-25 MED ORDER — IMS MIXTURE TEMPLATE
600 [IU] | Freq: Every day | ORAL | 0 refills | Status: DC
Start: 2017-12-25 — End: 2017-12-25
  Administered 2017-12-25 (×2): 600 [IU] via ORAL

## 2017-12-25 MED ORDER — LIPASE-PROTEASE-AMYLASE (CREON 24,000) 24,000-76,000- 120,000 UNIT PO CPDR
1 | ORAL_CAPSULE | Freq: Three times a day (TID) | ORAL | 1 refills | Status: CN
Start: 2017-12-25 — End: ?

## 2017-12-25 MED ORDER — PROCHLORPERAZINE EDISYLATE 5 MG/ML IJ SOLN
10 mg | Freq: Once | INTRAVENOUS | 0 refills | Status: CP
Start: 2017-12-25 — End: ?
  Administered 2017-12-25: 14:00:00 10 mg via INTRAVENOUS

## 2017-12-25 MED ORDER — MAGNESIUM SULFATE IN D5W 1 GRAM/100 ML IV PGBK
1 g | Freq: Once | INTRAVENOUS | 0 refills | Status: CP
Start: 2017-12-25 — End: ?
  Administered 2017-12-25: 14:00:00 1 g via INTRAVENOUS

## 2017-12-25 MED ORDER — CUPRIC CHLORIDE(#) INJ FOR PO 2MG/5ML
2 mg | Freq: Every day | ORAL | 0 refills | Status: DC
Start: 2017-12-25 — End: 2017-12-31
  Administered 2017-12-26 – 2017-12-31 (×7): 2 mg via ORAL

## 2017-12-25 MED ORDER — MV. MIN CMB#51-FA-K-Q10 100-350-5 MCG-MCG-MG PO CHEW
1 | Freq: Every day | ORAL | 0 refills | Status: DC
Start: 2017-12-25 — End: 2017-12-31
  Administered 2017-12-26 – 2017-12-31 (×7): 1 via ORAL

## 2017-12-25 MED ORDER — DIPHENHYDRAMINE IVPB
25 mg | Freq: Once | INTRAVENOUS | 0 refills | Status: CP
Start: 2017-12-25 — End: ?
  Administered 2017-12-25 (×2): 25 mg via INTRAVENOUS

## 2017-12-25 MED ORDER — ERGOCALCIFEROL (VITAMIN D2) 50,000 UNIT PO CAP
1 | ORAL_CAPSULE | ORAL | 1 refills | Status: CN
Start: 2017-12-25 — End: ?

## 2017-12-25 MED ORDER — SELENIUM 200 MCG PO TAB
200 ug | Freq: Every day | ORAL | 0 refills | Status: DC
Start: 2017-12-25 — End: 2017-12-31
  Administered 2017-12-26 – 2017-12-31 (×7): 200 ug via ORAL

## 2017-12-25 MED ORDER — DEXTROSE 50 % IN WATER (D50W) IV SYRG
50 mL | Freq: Once | INTRAVENOUS | 0 refills | Status: CP
Start: 2017-12-25 — End: ?
  Administered 2017-12-25: 21:00:00 50 mL via INTRAVENOUS

## 2017-12-26 LAB — POC GLUCOSE
Lab: 105 mg/dL — ABNORMAL HIGH (ref 70–100)
Lab: 79 mg/dL (ref 70–100)
Lab: 80 mg/dL — ABNORMAL LOW (ref 70–100)
Lab: 77 mg/dL (ref 70–100)
Lab: 72 mg/dL (ref 70–100)
Lab: 77 mg/dL (ref 70–100)
Lab: 51 mg/dL — ABNORMAL LOW (ref 70–100)
Lab: 61 mg/dL — ABNORMAL LOW (ref 70–100)
Lab: 55 mg/dL — ABNORMAL LOW (ref 70–100)
Lab: 105 mg/dL — ABNORMAL HIGH (ref 70–100)
Lab: 84 mg/dL (ref 70–100)
Lab: 85 mg/dL (ref 70–100)
Lab: 86 mg/dL (ref 70–100)
Lab: 85 mg/dL (ref 70–100)

## 2017-12-26 LAB — COMPREHENSIVE METABOLIC PANEL: Lab: 140 MMOL/L — ABNORMAL LOW (ref 137–147)

## 2017-12-26 LAB — MAGNESIUM: Lab: 2.4 mg/dL — ABNORMAL LOW (ref >60)

## 2017-12-26 LAB — CBC AND DIFF: Lab: 6.6 10*3/uL — ABNORMAL LOW (ref 4.5–11.0)

## 2017-12-26 LAB — PYRIDOXAL 5 PHOSPHATE

## 2017-12-26 LAB — PROTIME INR (PT): Lab: 1.8 mg/dL — ABNORMAL HIGH (ref 0.8–1.2)

## 2017-12-26 MED ORDER — DIPHENHYDRAMINE IVPB
25 mg | INTRAVENOUS | 0 refills | Status: DC | PRN
Start: 2017-12-26 — End: 2017-12-28
  Administered 2017-12-26 – 2017-12-28 (×12): 25 mg via INTRAVENOUS

## 2017-12-26 MED ORDER — PROCHLORPERAZINE EDISYLATE 5 MG/ML IJ SOLN
10 mg | INTRAVENOUS | 0 refills | Status: CP | PRN
Start: 2017-12-26 — End: ?
  Administered 2017-12-26 – 2017-12-27 (×3): 10 mg via INTRAVENOUS

## 2017-12-26 MED ORDER — MAGNESIUM SULFATE IN D5W 1 GRAM/100 ML IV PGBK
1 g | INTRAVENOUS | 0 refills | Status: DC | PRN
Start: 2017-12-26 — End: 2017-12-27
  Administered 2017-12-26 – 2017-12-27 (×2): 1 g via INTRAVENOUS

## 2017-12-27 LAB — POC GLUCOSE
Lab: 102 mg/dL — ABNORMAL HIGH (ref 70–100)
Lab: 110 mg/dL — ABNORMAL HIGH (ref 70–100)
Lab: 128 mg/dL — ABNORMAL HIGH (ref 70–100)
Lab: 55 mg/dL — ABNORMAL LOW (ref 70–100)
Lab: 63 mg/dL — ABNORMAL LOW (ref 70–100)
Lab: 64 mg/dL — ABNORMAL LOW (ref 70–100)
Lab: 67 mg/dL — ABNORMAL LOW (ref 70–100)
Lab: 67 mg/dL — ABNORMAL LOW (ref 70–100)
Lab: 72 mg/dL (ref 70–100)
Lab: 77 mg/dL — ABNORMAL LOW (ref 70–100)
Lab: 78 mg/dL (ref 70–100)
Lab: 79 mg/dL (ref 70–100)
Lab: 84 mg/dL (ref 70–100)
Lab: 85 mg/dL (ref 70–100)
Lab: 87 mg/dL (ref 70–100)
Lab: 92 mg/dL (ref 70–100)

## 2017-12-27 LAB — MAGNESIUM: Lab: 2.8 mg/dL — ABNORMAL HIGH (ref 1.6–2.6)

## 2017-12-27 LAB — CBC: Lab: 6.6 K/UL — ABNORMAL LOW (ref >60)

## 2017-12-27 LAB — PROTIME INR (PT): Lab: 1.7 MMOL/L — ABNORMAL HIGH (ref >60)

## 2017-12-27 LAB — BASIC METABOLIC PANEL: Lab: 139 MMOL/L — ABNORMAL LOW (ref >60)

## 2017-12-27 MED ORDER — PROCHLORPERAZINE EDISYLATE 5 MG/ML IJ SOLN
10 mg | INTRAVENOUS | 0 refills | Status: DC | PRN
Start: 2017-12-27 — End: 2017-12-31
  Administered 2017-12-28 – 2017-12-31 (×7): 10 mg via INTRAVENOUS

## 2017-12-27 MED ORDER — DEXTROSE 50 % IN WATER (D50W) IV SYRG
50 mL | Freq: Once | INTRAVENOUS | 0 refills | Status: DC
Start: 2017-12-27 — End: 2017-12-28

## 2017-12-28 LAB — MAGNESIUM: Lab: 2.4 mg/dL — ABNORMAL LOW (ref 1.6–2.6)

## 2017-12-28 LAB — POC GLUCOSE
Lab: 65 mg/dL — ABNORMAL LOW (ref 70–100)
Lab: 62 mg/dL — ABNORMAL LOW (ref 70–100)
Lab: 89 mg/dL (ref 70–100)
Lab: 99 mg/dL (ref 70–100)
Lab: 95 mg/dL (ref 70–100)
Lab: 79 mg/dL (ref 70–100)
Lab: 98 mg/dL — ABNORMAL LOW (ref 70–100)
Lab: 74 mg/dL (ref 70–100)
Lab: 63 mg/dL — ABNORMAL LOW (ref 70–100)
Lab: 114 mg/dL — ABNORMAL HIGH (ref 70–100)
Lab: 88 mg/dL (ref 70–100)
Lab: 74 mg/dL (ref 70–100)
Lab: 82 mg/dL (ref 70–100)
Lab: 88 mg/dL (ref 70–100)

## 2017-12-28 LAB — PROTIME INR (PT): Lab: 1.7 MMOL/L — ABNORMAL HIGH (ref 0.8–1.2)

## 2017-12-28 LAB — CBC: Lab: 6.4 K/UL — ABNORMAL LOW (ref 4.5–11.0)

## 2017-12-28 LAB — BASIC METABOLIC PANEL: Lab: 139 MMOL/L — ABNORMAL LOW (ref >60)

## 2017-12-28 LAB — SOLUBLE TRANSFERRIN RECEPTOR: Lab: 2.7

## 2017-12-28 MED ORDER — LIPASE-PROTEASE-AMYLASE (CREON 12,000) 12,000-38,000 -60,000 UNIT PO CPDR
1 | Freq: Three times a day (TID) | ORAL | 0 refills | Status: DC
Start: 2017-12-28 — End: 2017-12-28

## 2017-12-28 MED ORDER — DIPHENHYDRAMINE HCL 25 MG PO CAP
25 mg | ORAL | 0 refills | Status: DC | PRN
Start: 2017-12-28 — End: 2017-12-31
  Administered 2017-12-29 – 2017-12-31 (×5): 25 mg via ORAL

## 2017-12-28 MED ORDER — MAGNESIUM OXIDE 400 MG (241.3 MG MAGNESIUM) PO TAB
800 mg | ORAL_TABLET | Freq: Two times a day (BID) | ORAL | 1 refills | Status: CN
Start: 2017-12-28 — End: ?

## 2017-12-28 MED ORDER — LIPASE-PROTEASE-AMYLASE (CREON 24,000) 24,000-76,000- 120,000 UNIT PO CPDR
0 refills | Status: CN
Start: 2017-12-28 — End: ?

## 2017-12-28 MED ORDER — LIPASE-PROTEASE-AMYLASE (CREON 12,000) 12,000-38,000 -60,000 UNIT PO CPDR
1 | Freq: Three times a day (TID) | ORAL | 0 refills | Status: DC
Start: 2017-12-28 — End: 2017-12-31
  Administered 2017-12-28 – 2017-12-31 (×9): 1 via ORAL

## 2017-12-28 MED ADMIN — DEXTROSE 50 % IN WATER (D50W) IV SYRG [2365]: 25 mL | INTRAVENOUS | @ 15:00:00 | Stop: 2017-12-28 | NDC 00409751716

## 2017-12-29 ENCOUNTER — Encounter: Admit: 2017-12-29 | Discharge: 2017-12-29

## 2017-12-29 LAB — POC GLUCOSE
Lab: 101 mg/dL — ABNORMAL HIGH (ref 70–100)
Lab: 116 mg/dL — ABNORMAL HIGH (ref 70–100)
Lab: 72 mg/dL (ref 70–100)
Lab: 72 mg/dL (ref 70–100)
Lab: 73 mg/dL (ref 70–100)
Lab: 75 mg/dL (ref 70–100)
Lab: 79 mg/dL (ref 70–100)
Lab: 79 mg/dL (ref 70–100)
Lab: 83 mg/dL (ref 70–100)
Lab: 86 mg/dL (ref 70–100)
Lab: 92 mg/dL (ref 70–100)
Lab: 97 mg/dL (ref 70–100)
Lab: 97 mg/dL (ref 70–100)

## 2017-12-29 LAB — LEUKOCYTES, FECAL: Lab: NEGATIVE

## 2017-12-29 LAB — MAGNESIUM: Lab: 2.2 mg/dL — ABNORMAL LOW (ref 1.6–2.6)

## 2017-12-29 LAB — NIACIN LEVEL(VITAMIN B3): Lab: 7.7 mg/dL — ABNORMAL HIGH (ref 0.4–1.00)

## 2017-12-29 LAB — VITAMIN E: Lab: 3.6 g/dL — ABNORMAL LOW (ref 6.0–8.0)

## 2017-12-29 LAB — C DIFFICILE BY PCR

## 2017-12-29 LAB — PROTIME INR (PT): Lab: 1.9 M/UL — ABNORMAL HIGH (ref >60)

## 2017-12-29 LAB — BASIC METABOLIC PANEL: Lab: 139 MMOL/L — ABNORMAL LOW (ref >60)

## 2017-12-29 LAB — CBC: Lab: 6.3 K/UL — ABNORMAL LOW (ref >60)

## 2017-12-29 MED ORDER — EMU OIL 120ML
Freq: Two times a day (BID) | TOPICAL | 0 refills | Status: DC
Start: 2017-12-29 — End: 2017-12-31
  Administered 2017-12-30: 02:00:00 120.000 mL via TOPICAL

## 2017-12-29 MED ORDER — LOPERAMIDE 2 MG PO CAP
2 mg | Freq: Three times a day (TID) | ORAL | 0 refills | Status: DC
Start: 2017-12-29 — End: 2017-12-31
  Administered 2017-12-29 – 2017-12-31 (×6): 2 mg via ORAL

## 2017-12-30 LAB — POC GLUCOSE
Lab: 83 mg/dL (ref 70–100)
Lab: 86 mg/dL — AB (ref 70–100)
Lab: 69 mg/dL — ABNORMAL LOW (ref 70–100)
Lab: 70 mg/dL (ref 70–100)
Lab: 90 mg/dL (ref 70–100)
Lab: 66 mg/dL — ABNORMAL LOW (ref 70–100)
Lab: 69 mg/dL — ABNORMAL LOW (ref 70–100)
Lab: 74 mg/dL (ref 70–100)
Lab: 70 mg/dL (ref 70–100)
Lab: 54 mg/dL — ABNORMAL LOW (ref 70–100)
Lab: 68 mg/dL — ABNORMAL LOW (ref 70–100)
Lab: 77 mg/dL (ref 70–100)
Lab: 55 mg/dL — ABNORMAL LOW (ref 70–100)
Lab: 49 mg/dL — CL (ref 70–100)
Lab: 56 mg/dL — ABNORMAL LOW (ref 70–100)
Lab: 60 mg/dL — ABNORMAL LOW (ref 70–100)
Lab: 67 mg/dL — ABNORMAL LOW (ref 70–100)
Lab: 83 mg/dL (ref 70–100)
Lab: 87 mg/dL (ref 70–100)

## 2017-12-30 LAB — PROTIME INR (PT): Lab: 1.8 MMOL/L — ABNORMAL HIGH (ref >60)

## 2017-12-30 LAB — BASIC METABOLIC PANEL: Lab: 142 MMOL/L — ABNORMAL LOW (ref 137–147)

## 2017-12-30 LAB — CBC: Lab: 5.7 K/UL — ABNORMAL LOW (ref 4.5–11.0)

## 2017-12-30 LAB — MAGNESIUM: Lab: 2.1 mg/dL — ABNORMAL LOW (ref 1.6–2.6)

## 2017-12-31 ENCOUNTER — Inpatient Hospital Stay: Admit: 2017-12-21 | Discharge: 2017-12-21

## 2017-12-31 ENCOUNTER — Inpatient Hospital Stay
Admit: 2017-12-17 | Discharge: 2017-12-31 | Disposition: A | Discharge status: Rehabilitation Facility | Payer: Medicaid Other

## 2017-12-31 ENCOUNTER — Inpatient Hospital Stay: Admit: 2017-12-18 | Discharge: 2017-12-18

## 2017-12-31 ENCOUNTER — Inpatient Hospital Stay: Admit: 2017-12-22 | Discharge: 2017-12-22

## 2017-12-31 ENCOUNTER — Inpatient Hospital Stay: Admit: 2017-12-17 | Discharge: 2017-12-17

## 2017-12-31 ENCOUNTER — Encounter: Admit: 2017-12-31 | Discharge: 2017-12-31

## 2017-12-31 ENCOUNTER — Inpatient Hospital Stay: Admit: 2017-12-19 | Discharge: 2017-12-19

## 2017-12-31 ENCOUNTER — Emergency Department: Admit: 2017-12-17 | Discharge: 2017-12-17

## 2017-12-31 DIAGNOSIS — R188 Other ascites: ICD-10-CM

## 2017-12-31 DIAGNOSIS — K7290 Hepatic failure, unspecified without coma: ICD-10-CM

## 2017-12-31 DIAGNOSIS — K911 Postgastric surgery syndromes: ICD-10-CM

## 2017-12-31 DIAGNOSIS — N179 Acute kidney failure, unspecified: Principal | ICD-10-CM

## 2017-12-31 DIAGNOSIS — Z720 Tobacco use: ICD-10-CM

## 2017-12-31 DIAGNOSIS — Z9181 History of falling: ICD-10-CM

## 2017-12-31 DIAGNOSIS — E43 Unspecified severe protein-calorie malnutrition: ICD-10-CM

## 2017-12-31 DIAGNOSIS — R64 Cachexia: ICD-10-CM

## 2017-12-31 DIAGNOSIS — Z6836 Body mass index (BMI) 36.0-36.9, adult: ICD-10-CM

## 2017-12-31 DIAGNOSIS — E042 Nontoxic multinodular goiter: ICD-10-CM

## 2017-12-31 DIAGNOSIS — E162 Hypoglycemia, unspecified: ICD-10-CM

## 2017-12-31 DIAGNOSIS — R946 Abnormal results of thyroid function studies: ICD-10-CM

## 2017-12-31 DIAGNOSIS — K7460 Unspecified cirrhosis of liver: ICD-10-CM

## 2017-12-31 DIAGNOSIS — R161 Splenomegaly, not elsewhere classified: ICD-10-CM

## 2017-12-31 DIAGNOSIS — K766 Portal hypertension: ICD-10-CM

## 2017-12-31 DIAGNOSIS — K7581 Nonalcoholic steatohepatitis (NASH): ICD-10-CM

## 2017-12-31 DIAGNOSIS — D61818 Other pancytopenia: ICD-10-CM

## 2017-12-31 DIAGNOSIS — M1710 Unilateral primary osteoarthritis, unspecified knee: ICD-10-CM

## 2017-12-31 DIAGNOSIS — E8770 Fluid overload, unspecified: ICD-10-CM

## 2017-12-31 DIAGNOSIS — M7989 Other specified soft tissue disorders: ICD-10-CM

## 2017-12-31 DIAGNOSIS — E876 Hypokalemia: ICD-10-CM

## 2017-12-31 DIAGNOSIS — I8500 Esophageal varices without bleeding: ICD-10-CM

## 2017-12-31 DIAGNOSIS — D689 Coagulation defect, unspecified: ICD-10-CM

## 2017-12-31 DIAGNOSIS — Z9049 Acquired absence of other specified parts of digestive tract: ICD-10-CM

## 2017-12-31 DIAGNOSIS — R197 Diarrhea, unspecified: ICD-10-CM

## 2017-12-31 DIAGNOSIS — Z9884 Bariatric surgery status: ICD-10-CM

## 2017-12-31 DIAGNOSIS — E559 Vitamin D deficiency, unspecified: ICD-10-CM

## 2017-12-31 DIAGNOSIS — N183 Chronic kidney disease, stage 3 (moderate): ICD-10-CM

## 2017-12-31 DIAGNOSIS — E560 Deficiency of vitamin E: ICD-10-CM

## 2017-12-31 DIAGNOSIS — M25561 Pain in right knee: ICD-10-CM

## 2017-12-31 DIAGNOSIS — D638 Anemia in other chronic diseases classified elsewhere: ICD-10-CM

## 2017-12-31 DIAGNOSIS — E610 Copper deficiency: ICD-10-CM

## 2017-12-31 DIAGNOSIS — R112 Nausea with vomiting, unspecified: ICD-10-CM

## 2017-12-31 DIAGNOSIS — Z79899 Other long term (current) drug therapy: ICD-10-CM

## 2017-12-31 DIAGNOSIS — E509 Vitamin A deficiency, unspecified: ICD-10-CM

## 2017-12-31 LAB — POC GLUCOSE
Lab: 80 mg/dL (ref 70–100)
Lab: 75 mg/dL (ref 70–100)
Lab: 79 mg/dL (ref 70–100)
Lab: 65 mg/dL — ABNORMAL LOW (ref 70–100)
Lab: 77 mg/dL (ref 70–100)
Lab: 92 mg/dL (ref 70–100)
Lab: 68 mg/dL — ABNORMAL LOW (ref >60)
Lab: 73 mg/dL (ref 70–100)
Lab: 72 mg/dL (ref 70–100)
Lab: 78 mg/dL (ref 70–100)
Lab: 38 mg/dL — CL (ref 70–100)
Lab: 36 mg/dL — CL (ref 70–100)
Lab: 51 mg/dL — ABNORMAL LOW (ref 70–100)
Lab: 140 mg/dL — ABNORMAL HIGH (ref 70–100)
Lab: 88 mg/dL (ref 70–100)

## 2017-12-31 LAB — MAGNESIUM: Lab: 2 mg/dL — ABNORMAL LOW (ref 1.6–2.6)

## 2017-12-31 LAB — BASIC METABOLIC PANEL: Lab: 140 MMOL/L — ABNORMAL LOW (ref 137–147)

## 2017-12-31 LAB — PROTIME INR (PT): Lab: 1.8 % — ABNORMAL HIGH (ref 0.8–1.2)

## 2017-12-31 LAB — CBC: Lab: 5.1 K/UL — CL (ref 4.5–11.0)

## 2017-12-31 MED ORDER — DICLOFENAC SODIUM 1 % TP GEL
4 g | Freq: Two times a day (BID) | TOPICAL | 0 refills | Status: DC
Start: 2017-12-31 — End: 2018-01-07
  Administered 2018-01-01 – 2018-01-02 (×2): 4 g via TOPICAL

## 2017-12-31 MED ORDER — SELENIUM 200 MCG PO TAB
200 ug | Freq: Every day | ORAL | 0 refills | Status: SS
Start: 2017-12-31 — End: 2018-01-07

## 2017-12-31 MED ORDER — LIDOCAINE 5 % TP PTMD
1 | TOPICAL | 0 refills | Status: DC
Start: 2017-12-31 — End: 2018-01-07
  Administered 2018-01-01 – 2018-01-05 (×5): 1 via TOPICAL

## 2017-12-31 MED ORDER — MULTIVIT-IRON-FA-CALCIUM-MINS 9 MG IRON-400 MCG PO TAB
1 | Freq: Two times a day (BID) | ORAL | 0 refills | Status: DC
Start: 2017-12-31 — End: 2018-01-07
  Administered 2018-01-01 – 2018-01-07 (×14): 1 via ORAL

## 2017-12-31 MED ORDER — MV. MIN CMB#51-FA-K-Q10 100-350-5 MCG-MCG-MG PO CHEW
1 | Freq: Every day | ORAL | 0 refills | Status: DC
Start: 2017-12-31 — End: 2018-01-07
  Administered 2018-01-01 – 2018-01-07 (×7): 1 via ORAL

## 2017-12-31 MED ORDER — DEXTROSE 50 % IN WATER (D50W) IV SYRG
50 mL | Freq: Once | INTRAVENOUS | 0 refills | Status: CP
Start: 2017-12-31 — End: ?
  Administered 2017-12-31: 20:00:00 50 mL via INTRAVENOUS

## 2017-12-31 MED ORDER — TRAMADOL 50 MG PO TAB
50 mg | ORAL | 0 refills | Status: DC | PRN
Start: 2017-12-31 — End: 2018-01-07
  Administered 2018-01-02 – 2018-01-07 (×16): 50 mg via ORAL

## 2017-12-31 MED ORDER — ONDANSETRON HCL 4 MG PO TAB
4 mg | ORAL | 0 refills | Status: DC | PRN
Start: 2017-12-31 — End: 2017-12-31

## 2017-12-31 MED ORDER — FUROSEMIDE 40 MG PO TAB
40 mg | Freq: Every day | ORAL | 0 refills | Status: DC
Start: 2017-12-31 — End: 2017-12-31

## 2017-12-31 MED ORDER — THIAMINE MONONITRATE (VIT B1) 100 MG PO TAB
100 mg | Freq: Every day | ORAL | 0 refills | Status: DC
Start: 2017-12-31 — End: 2018-01-07
  Administered 2018-01-01 – 2018-01-07 (×7): 100 mg via ORAL

## 2017-12-31 MED ORDER — ALUMINUM-MAGNESIUM HYDROXIDE 200-200 MG/5 ML PO SUSP
30 mL | ORAL | 0 refills | Status: DC | PRN
Start: 2017-12-31 — End: 2018-01-07

## 2017-12-31 MED ORDER — EMU OIL 120ML
TOPICAL | 0 refills | 22.50000 days | Status: AC | PRN
Start: 2017-12-31 — End: 2018-01-07

## 2017-12-31 MED ORDER — TRIAMCINOLONE ACETONIDE 0.1 % TP CREA
Freq: Three times a day (TID) | TOPICAL | 0 refills | Status: DC
Start: 2017-12-31 — End: 2017-12-31

## 2017-12-31 MED ORDER — MIDODRINE 5 MG PO TAB
10 mg | Freq: Two times a day (BID) | ORAL | 0 refills | Status: DC
Start: 2017-12-31 — End: 2018-01-07
  Administered 2017-12-31 – 2018-01-07 (×14): 10 mg via ORAL

## 2017-12-31 MED ORDER — ERGOCALCIFEROL (VITAMIN D2) 50,000 UNIT PO CAP
50000 [IU] | ORAL | 0 refills | Status: DC
Start: 2017-12-31 — End: 2018-01-07
  Administered 2018-01-01 – 2018-01-06 (×3): 50000 [IU] via ORAL

## 2017-12-31 MED ORDER — MULTIVIT-IRON-FA-CALCIUM-MINS 9 MG IRON-400 MCG PO TAB
1 | Freq: Two times a day (BID) | ORAL | 0 refills | 33.00000 days | Status: AC
Start: 2017-12-31 — End: 2018-04-28

## 2017-12-31 MED ORDER — NYSTATIN 100,000 UNIT/GRAM TP POWD
Freq: Two times a day (BID) | TOPICAL | 0 refills | Status: DC
Start: 2017-12-31 — End: 2018-01-07
  Administered 2018-01-01: 02:00:00 via TOPICAL

## 2017-12-31 MED ORDER — ERGOCALCIFEROL (VITAMIN D2) 50,000 UNIT PO CAP
1 | ORAL | 0 refills | Status: SS
Start: 2017-12-31 — End: 2018-01-07

## 2017-12-31 MED ORDER — VITS A AND D-WHITE PET-LANOLIN TP OINT
TOPICAL | 0 refills | Status: DC | PRN
Start: 2017-12-31 — End: 2017-12-31

## 2017-12-31 MED ORDER — VITS A AND D-WHITE PET-LANOLIN TP OINT
0 refills | Status: AC | PRN
Start: 2017-12-31 — End: 2018-01-07

## 2017-12-31 MED ORDER — PROMETHAZINE 25 MG PO TAB
25 mg | ORAL | 0 refills | Status: DC | PRN
Start: 2017-12-31 — End: 2018-01-07
  Administered 2017-12-31 – 2018-01-07 (×19): 25 mg via ORAL

## 2017-12-31 MED ORDER — LOPERAMIDE 2 MG PO CAP
ORAL_CAPSULE | ORAL | 0 refills | 10.00000 days | Status: AC
Start: 2017-12-31 — End: ?

## 2017-12-31 MED ORDER — DIPHENHYDRAMINE HCL 25 MG PO CAP
25 mg | ORAL | 0 refills | Status: DC | PRN
Start: 2017-12-31 — End: 2018-01-07
  Administered 2018-01-01 (×2): 25 mg via ORAL

## 2017-12-31 MED ORDER — THIAMINE MONONITRATE (VIT B1) 100 MG PO TAB
100 mg | ORAL_TABLET | Freq: Every day | ORAL | 1 refills | Status: SS
Start: 2017-12-31 — End: 2018-01-07

## 2017-12-31 MED ORDER — PANTOPRAZOLE 40 MG PO TBEC
40 mg | Freq: Every day | ORAL | 0 refills | Status: DC
Start: 2017-12-31 — End: 2018-01-07
  Administered 2018-01-01 – 2018-01-07 (×6): 40 mg via ORAL

## 2017-12-31 MED ORDER — DIPHENHYDRAMINE HCL 25 MG PO CAP
25 mg | ORAL_CAPSULE | ORAL | 3 refills | 6.00000 days | Status: AC | PRN
Start: 2017-12-31 — End: 2018-01-07

## 2017-12-31 MED ORDER — FUROSEMIDE 40 MG PO TAB
60 mg | ORAL_TABLET | Freq: Every morning | ORAL | 1 refills | Status: SS
Start: 2017-12-31 — End: 2018-01-15

## 2017-12-31 MED ORDER — IMS MIXTURE TEMPLATE
60 mg | Freq: Every day | ORAL | 0 refills | Status: DC
Start: 2017-12-31 — End: 2018-01-07
  Administered 2018-01-01 – 2018-01-07 (×14): 60 mg via ORAL

## 2017-12-31 MED ORDER — EMU OIL 120ML
Freq: Two times a day (BID) | TOPICAL | 0 refills | Status: DC
Start: 2017-12-31 — End: 2018-01-07
  Administered 2018-01-01: 15:00:00 120.000 mL via TOPICAL

## 2017-12-31 MED ORDER — PROCHLORPERAZINE EDISYLATE 5 MG/ML IJ SOLN
10 mg | INTRAVENOUS | 0 refills | Status: DC | PRN
Start: 2017-12-31 — End: 2018-01-07
  Administered 2018-01-01 – 2018-01-07 (×2): 10 mg via INTRAVENOUS

## 2017-12-31 MED ORDER — ENOXAPARIN 40 MG/0.4 ML SC SYRG
40 mg | Freq: Every day | SUBCUTANEOUS | 0 refills | Status: DC
Start: 2017-12-31 — End: 2017-12-31

## 2017-12-31 MED ORDER — LIPASE-PROTEASE-AMYLASE (CREON 12,000) 12,000-38,000 -60,000 UNIT PO CPDR
1 | ORAL_CAPSULE | Freq: Three times a day (TID) | ORAL | 3 refills | Status: SS
Start: 2017-12-31 — End: 2018-01-07

## 2017-12-31 MED ORDER — CUPRIC CHLORIDE(#) INJ FOR PO 2MG/5ML
2 mg | Freq: Every day | ORAL | 0 refills | Status: DC
Start: 2017-12-31 — End: 2018-01-07
  Administered 2018-01-01 – 2018-01-07 (×7): 2 mg via ORAL

## 2017-12-31 MED ORDER — SELENIUM 200 MCG PO TAB
200 ug | Freq: Every day | ORAL | 0 refills | Status: DC
Start: 2017-12-31 — End: 2018-01-07
  Administered 2018-01-01 – 2018-01-07 (×7): 200 ug via ORAL

## 2017-12-31 MED ORDER — MIDODRINE 10 MG PO TAB
10 mg | ORAL_TABLET | Freq: Two times a day (BID) | ORAL | 1 refills | Status: SS
Start: 2017-12-31 — End: 2018-01-07

## 2017-12-31 MED ORDER — LOPERAMIDE 2 MG PO CAP
2 mg | Freq: Three times a day (TID) | ORAL | 0 refills | Status: DC
Start: 2017-12-31 — End: 2018-01-07
  Administered 2017-12-31 – 2018-01-07 (×21): 2 mg via ORAL

## 2017-12-31 MED ORDER — LIPASE-PROTEASE-AMYLASE (CREON 12,000) 12,000-38,000 -60,000 UNIT PO CPDR
1 | Freq: Three times a day (TID) | ORAL | 0 refills | Status: DC
Start: 2017-12-31 — End: 2018-01-07
  Administered 2017-12-31 – 2018-01-07 (×20): 1 via ORAL

## 2017-12-31 MED ORDER — MV. MIN CMB#51-FA-K-Q10 100-350-5 MCG-MCG-MG PO CHEW
1 | Freq: Every day | ORAL | 0 refills | Status: SS
Start: 2017-12-31 — End: 2018-01-07

## 2017-12-31 MED FILL — MIDODRINE 10 MG PO TAB: 10 mg | ORAL | 30 days supply | Qty: 60 | Status: CN

## 2017-12-31 MED FILL — FUROSEMIDE 40 MG PO TAB: 40 mg | ORAL | 30 days supply | Qty: 45 | Status: CN

## 2018-01-01 ENCOUNTER — Encounter: Admit: 2018-01-01 | Discharge: 2018-01-01

## 2018-01-01 DIAGNOSIS — E669 Obesity, unspecified: ICD-10-CM

## 2018-01-01 DIAGNOSIS — M1990 Unspecified osteoarthritis, unspecified site: Principal | ICD-10-CM

## 2018-01-01 DIAGNOSIS — K760 Fatty (change of) liver, not elsewhere classified: ICD-10-CM

## 2018-01-01 DIAGNOSIS — E43 Unspecified severe protein-calorie malnutrition: ICD-10-CM

## 2018-01-01 LAB — POC GLUCOSE
Lab: 56 mg/dL — ABNORMAL LOW (ref 70–100)
Lab: 48 mg/dL — CL (ref 70–100)
Lab: 48 mg/dL — CL (ref 70–100)
Lab: 56 mg/dL — ABNORMAL LOW (ref 70–100)
Lab: 121 mg/dL — ABNORMAL HIGH (ref 70–100)
Lab: 64 mg/dL — ABNORMAL LOW (ref 70–100)
Lab: 66 mg/dL — ABNORMAL LOW (ref 70–100)
Lab: 84 mg/dL — ABNORMAL HIGH (ref 70–100)
Lab: 72 mg/dL (ref 70–100)
Lab: 62 mg/dL — ABNORMAL LOW (ref 70–100)
Lab: 71 mg/dL (ref 70–100)
Lab: 45 mg/dL — CL (ref 70–100)
Lab: 49 mg/dL — CL (ref 70–100)
Lab: 143 mg/dL — ABNORMAL HIGH (ref 70–100)
Lab: 43 mg/dL — CL (ref 70–100)

## 2018-01-01 LAB — CBC CELLULAR THERAPEUTICS: Lab: 4.7 K/UL — ABNORMAL LOW (ref >60)

## 2018-01-01 LAB — PROTIME INR (PT): Lab: 1.7 MMOL/L — ABNORMAL HIGH (ref >60)

## 2018-01-01 LAB — BASIC METABOLIC PANEL CELLULAR THERAPEUTICS: Lab: 139 MMOL/L — ABNORMAL LOW (ref 137–147)

## 2018-01-01 MED ORDER — VITS A AND D-WHITE PET-LANOLIN TP OINT
Freq: Two times a day (BID) | TOPICAL | 0 refills | Status: DC
Start: 2018-01-01 — End: 2018-01-07
  Administered 2018-01-02: 02:00:00 via TOPICAL

## 2018-01-01 MED ORDER — SODIUM CHLORIDE 0.9 % FLUSH
20 mL | Freq: Three times a day (TID) | INTRAVENOUS | 0 refills | Status: DC
Start: 2018-01-01 — End: 2018-01-07

## 2018-01-01 MED ADMIN — DEXTROSE 50 % IN WATER (D50W) IV SYRG [2365]: 50 mL | INTRAVENOUS | @ 03:00:00 | Stop: 2018-01-01 | NDC 00409751716

## 2018-01-01 MED ADMIN — DEXTROSE 50 % IN WATER (D50W) IV SYRG [2365]: 50 mL | INTRAVENOUS | @ 18:00:00 | Stop: 2018-01-01 | NDC 00409751716

## 2018-01-02 LAB — PROTIME INR (PT): Lab: 1.7 MMOL/L — ABNORMAL HIGH (ref >60)

## 2018-01-02 LAB — BASIC METABOLIC PANEL CELLULAR THERAPEUTICS: Lab: 140 MMOL/L — ABNORMAL LOW (ref 137–147)

## 2018-01-02 LAB — CBC CELLULAR THERAPEUTICS: Lab: 4.7 K/UL — ABNORMAL LOW (ref 4.5–11.0)

## 2018-01-02 LAB — POC GLUCOSE: Lab: 69 mg/dL — ABNORMAL LOW (ref 70–100)

## 2018-01-03 LAB — BASIC METABOLIC PANEL CELLULAR THERAPEUTICS: Lab: 140 MMOL/L — ABNORMAL LOW (ref >60)

## 2018-01-03 LAB — CBC CELLULAR THERAPEUTICS: Lab: 5.2 10*3/uL — ABNORMAL LOW (ref >60)

## 2018-01-03 LAB — PROTIME INR (PT): Lab: 1.7 MMOL/L — ABNORMAL HIGH (ref >60)

## 2018-01-04 ENCOUNTER — Encounter: Admit: 2018-01-04 | Discharge: 2018-01-04

## 2018-01-04 LAB — PROTIME INR (PT): Lab: 1.7 MMOL/L — ABNORMAL HIGH (ref >60)

## 2018-01-04 LAB — POC GLUCOSE: Lab: 59 mg/dL — ABNORMAL LOW (ref 70–100)

## 2018-01-04 LAB — BASIC METABOLIC PANEL CELLULAR THERAPEUTICS: Lab: 136 MMOL/L — ABNORMAL LOW (ref >60)

## 2018-01-04 LAB — CBC CELLULAR THERAPEUTICS: Lab: 5.3 K/UL — ABNORMAL LOW (ref >60)

## 2018-01-04 MED ORDER — LIDOCAINE 5 % TP PTMD
1 | Freq: Every day | TOPICAL | 0 refills | Status: DC
Start: 2018-01-04 — End: 2018-01-07
  Administered 2018-01-05 – 2018-01-07 (×3): 1 via TOPICAL

## 2018-01-05 LAB — PROTIME INR (PT): Lab: 1.9 MMOL/L — ABNORMAL HIGH (ref >60)

## 2018-01-05 LAB — BASIC METABOLIC PANEL CELLULAR THERAPEUTICS: Lab: 138 MMOL/L — ABNORMAL LOW (ref >60)

## 2018-01-05 LAB — CBC CELLULAR THERAPEUTICS: Lab: 4.7 K/UL — ABNORMAL HIGH (ref >60)

## 2018-01-06 LAB — URINALYSIS DIPSTICK REFLEX TO CULTURE
Lab: NEGATIVE
Lab: POSITIVE — AB
Lab: NEGATIVE
Lab: NEGATIVE

## 2018-01-06 LAB — CBC CELLULAR THERAPEUTICS: Lab: 3.8 K/UL — ABNORMAL LOW (ref 4.5–11.0)

## 2018-01-06 LAB — URINALYSIS MICROSCOPIC REFLEX TO CULTURE

## 2018-01-06 LAB — PROTIME INR (PT): Lab: 1.8 mg/dL — ABNORMAL HIGH (ref <0.4)

## 2018-01-06 LAB — POC GLUCOSE
Lab: 43 mg/dL — CL (ref 70–100)
Lab: 49 mg/dL — CL (ref 70–100)
Lab: 47 mg/dL — CL (ref 70–100)
Lab: 51 mg/dL — ABNORMAL LOW (ref 70–100)

## 2018-01-06 LAB — BASIC METABOLIC PANEL CELLULAR THERAPEUTICS: Lab: 140 MMOL/L — ABNORMAL LOW (ref 137–147)

## 2018-01-06 MED ORDER — SULFAMETHOXAZOLE-TRIMETHOPRIM 800-160 MG PO TAB
1 | Freq: Two times a day (BID) | ORAL | 0 refills | Status: DC
Start: 2018-01-06 — End: 2018-01-07
  Administered 2018-01-06 – 2018-01-07 (×3): 1 via ORAL

## 2018-01-06 MED ORDER — DEXTROSE 50 % IN WATER (D50W) IV SYRG
50 mL | INTRAVENOUS | 0 refills | Status: DC | PRN
Start: 2018-01-06 — End: 2018-01-07

## 2018-01-07 ENCOUNTER — Inpatient Hospital Stay: Admit: 2017-12-31 | Discharge: 2018-01-07 | Payer: Medicaid Other

## 2018-01-07 ENCOUNTER — Encounter: Admit: 2018-01-07 | Discharge: 2018-01-07

## 2018-01-07 DIAGNOSIS — D61818 Other pancytopenia: ICD-10-CM

## 2018-01-07 DIAGNOSIS — Z9049 Acquired absence of other specified parts of digestive tract: ICD-10-CM

## 2018-01-07 DIAGNOSIS — N179 Acute kidney failure, unspecified: ICD-10-CM

## 2018-01-07 DIAGNOSIS — Z9884 Bariatric surgery status: ICD-10-CM

## 2018-01-07 DIAGNOSIS — Z6827 Body mass index (BMI) 27.0-27.9, adult: ICD-10-CM

## 2018-01-07 DIAGNOSIS — R739 Hyperglycemia, unspecified: ICD-10-CM

## 2018-01-07 DIAGNOSIS — K911 Postgastric surgery syndromes: ICD-10-CM

## 2018-01-07 DIAGNOSIS — R197 Diarrhea, unspecified: ICD-10-CM

## 2018-01-07 DIAGNOSIS — E8770 Fluid overload, unspecified: ICD-10-CM

## 2018-01-07 DIAGNOSIS — F4321 Adjustment disorder with depressed mood: ICD-10-CM

## 2018-01-07 DIAGNOSIS — R5381 Other malaise: ICD-10-CM

## 2018-01-07 DIAGNOSIS — K7460 Unspecified cirrhosis of liver: ICD-10-CM

## 2018-01-07 DIAGNOSIS — M1711 Unilateral primary osteoarthritis, right knee: ICD-10-CM

## 2018-01-07 DIAGNOSIS — Z515 Encounter for palliative care: ICD-10-CM

## 2018-01-07 DIAGNOSIS — R4189 Other symptoms and signs involving cognitive functions and awareness: ICD-10-CM

## 2018-01-07 DIAGNOSIS — K7581 Nonalcoholic steatohepatitis (NASH): ICD-10-CM

## 2018-01-07 LAB — POC GLUCOSE
Lab: 64 mg/dL — ABNORMAL LOW (ref 70–100)
Lab: 65 mg/dL — ABNORMAL LOW (ref 70–100)
Lab: 41 mg/dL — CL (ref >60)
Lab: 93 mg/dL — ABNORMAL LOW (ref 70–100)
Lab: 64 mg/dL — ABNORMAL LOW (ref 70–100)
Lab: 129 mg/dL — ABNORMAL HIGH (ref 70–100)
Lab: 58 mg/dL — ABNORMAL LOW (ref 70–100)

## 2018-01-07 LAB — CULTURE-URINE W/SENSITIVITY
Lab: <10
Lab: <10 — AB

## 2018-01-07 LAB — PROTIME INR (PT): Lab: 1.9 g/dL — ABNORMAL HIGH (ref 0.8–1.2)

## 2018-01-07 MED ORDER — MULTIVIT-IRON-FA-CALCIUM-MINS 9 MG IRON-400 MCG PO TAB
1 | Freq: Two times a day (BID) | ORAL | 0 refills | Status: CN
Start: 2018-01-07 — End: ?

## 2018-01-07 MED ORDER — DICLOFENAC SODIUM 1 % TP GEL
4 g | Freq: Two times a day (BID) | TOPICAL | 0 refills | Status: CN
Start: 2018-01-07 — End: ?

## 2018-01-07 MED ORDER — PANTOPRAZOLE 40 MG PO TBEC
40 mg | Freq: Every day | ORAL | 0 refills | Status: CN
Start: 2018-01-07 — End: ?

## 2018-01-07 MED ORDER — TRAMADOL 50 MG PO TAB
50 mg | ORAL | 0 refills | Status: CN | PRN
Start: 2018-01-07 — End: ?

## 2018-01-07 MED ORDER — EMU OIL 120ML
Freq: Two times a day (BID) | TOPICAL | 0 refills | Status: DC
Start: 2018-01-07 — End: 2018-01-15
  Administered 2018-01-08: 01:00:00 120.000 mL via TOPICAL

## 2018-01-07 MED ORDER — SULFAMETHOXAZOLE-TRIMETHOPRIM 800-160 MG PO TAB
1 | ORAL_TABLET | Freq: Two times a day (BID) | ORAL | 0 refills | Status: AC
Start: 2018-01-07 — End: 2018-01-15

## 2018-01-07 MED ORDER — MV. MIN CMB#51-FA-K-Q10 100-350-5 MCG-MCG-MG PO CHEW
1 | ORAL_TABLET | Freq: Every day | ORAL | 1 refills | Status: AC
Start: 2018-01-07 — End: ?

## 2018-01-07 MED ORDER — PROCHLORPERAZINE EDISYLATE 5 MG/ML IJ SOLN
10 mg | INTRAVENOUS | 0 refills | Status: DC | PRN
Start: 2018-01-07 — End: 2018-01-15
  Administered 2018-01-08 – 2018-01-15 (×16): 10 mg via INTRAVENOUS

## 2018-01-07 MED ORDER — LIDOCAINE 5 % TP PTMD
1 | Freq: Every day | TOPICAL | 0 refills | Status: DC
Start: 2018-01-07 — End: 2018-01-15
  Administered 2018-01-08 – 2018-01-14 (×3): 1 via TOPICAL

## 2018-01-07 MED ORDER — SODIUM CHLORIDE 0.9 % FLUSH
20 mL | Freq: Three times a day (TID) | INTRAVENOUS | 0 refills | Status: DC
Start: 2018-01-07 — End: 2018-01-15

## 2018-01-07 MED ORDER — ALUMINUM-MAGNESIUM HYDROXIDE 200-200 MG/5 ML PO SUSP
30 mL | ORAL | 0 refills | Status: CN | PRN
Start: 2018-01-07 — End: ?

## 2018-01-07 MED ORDER — ERGOCALCIFEROL (VITAMIN D2) 50,000 UNIT PO CAP
1 | ORAL_CAPSULE | ORAL | 1 refills | 56.00000 days | Status: AC
Start: 2018-01-07 — End: 2018-04-28

## 2018-01-07 MED ORDER — SODIUM CHLORIDE 0.9 % FLUSH
20 mL | Freq: Three times a day (TID) | INTRAVENOUS | 0 refills | Status: CN
Start: 2018-01-07 — End: ?

## 2018-01-07 MED ORDER — DIPHENHYDRAMINE HCL 25 MG PO CAP
25 mg | ORAL | 0 refills | Status: CN | PRN
Start: 2018-01-07 — End: ?

## 2018-01-07 MED ORDER — TRAMADOL 50 MG PO TAB
50 mg | ORAL | 0 refills | Status: DC | PRN
Start: 2018-01-07 — End: 2018-01-15
  Administered 2018-01-08 – 2018-01-15 (×6): 50 mg via ORAL

## 2018-01-07 MED ORDER — CUPRIC CHLORIDE(#) INJ FOR PO 2MG/5ML
2 mg | Freq: Every day | ORAL | 0 refills | Status: DC
Start: 2018-01-07 — End: 2018-01-15
  Administered 2018-01-08 – 2018-01-15 (×8): 2 mg via ORAL

## 2018-01-07 MED ORDER — ALUMINUM-MAGNESIUM HYDROXIDE 200-200 MG/5 ML PO SUSP
30 mL | ORAL | 0 refills | Status: DC | PRN
Start: 2018-01-07 — End: 2018-01-15

## 2018-01-07 MED ORDER — MV. MIN CMB#51-FA-K-Q10 100-350-5 MCG-MCG-MG PO CHEW
1 | Freq: Every day | ORAL | 0 refills | Status: CN
Start: 2018-01-07 — End: ?

## 2018-01-07 MED ORDER — DEXTROSE 50 % IN WATER (D50W) IV SYRG
50 mL | INTRAVENOUS | 0 refills | Status: CN | PRN
Start: 2018-01-07 — End: ?

## 2018-01-07 MED ORDER — MIDODRINE 5 MG PO TAB
10 mg | Freq: Two times a day (BID) | ORAL | 0 refills | Status: CN
Start: 2018-01-07 — End: ?

## 2018-01-07 MED ORDER — IMS MIXTURE TEMPLATE
60 mg | Freq: Every day | ORAL | 0 refills | Status: DC
Start: 2018-01-07 — End: 2018-01-14
  Administered 2018-01-08 – 2018-01-12 (×9): 60 mg via ORAL
  Administered 2018-01-14: 14:00:00 40 mg via ORAL

## 2018-01-07 MED ORDER — SULFAMETHOXAZOLE-TRIMETHOPRIM 800-160 MG PO TAB
1 | Freq: Two times a day (BID) | ORAL | 0 refills | Status: CN
Start: 2018-01-07 — End: ?

## 2018-01-07 MED ORDER — LIPASE-PROTEASE-AMYLASE (CREON 12,000) 12,000-38,000 -60,000 UNIT PO CPDR
1 | Freq: Three times a day (TID) | ORAL | 0 refills | Status: DC
Start: 2018-01-07 — End: 2018-01-08
  Administered 2018-01-07 – 2018-01-08 (×3): 1 via ORAL

## 2018-01-07 MED ORDER — MULTIVIT-IRON-FA-CALCIUM-MINS 9 MG IRON-400 MCG PO TAB
1 | Freq: Two times a day (BID) | ORAL | 0 refills | Status: DC
Start: 2018-01-07 — End: 2018-01-15
  Administered 2018-01-08 – 2018-01-15 (×14): 1 via ORAL

## 2018-01-07 MED ORDER — THIAMINE MONONITRATE (VIT B1) 100 MG PO TAB
100 mg | ORAL_TABLET | Freq: Every day | ORAL | 1 refills | 10.00000 days | Status: AC
Start: 2018-01-07 — End: ?

## 2018-01-07 MED ORDER — LIPASE-PROTEASE-AMYLASE (CREON 12,000) 12,000-38,000 -60,000 UNIT PO CPDR
1 | Freq: Three times a day (TID) | ORAL | 0 refills | Status: CN
Start: 2018-01-07 — End: ?

## 2018-01-07 MED ORDER — PROCHLORPERAZINE EDISYLATE 5 MG/ML IJ SOLN
10 mg | INTRAVENOUS | 0 refills | Status: CN | PRN
Start: 2018-01-07 — End: ?

## 2018-01-07 MED ORDER — NYSTATIN 100,000 UNIT/GRAM TP POWD
Freq: Two times a day (BID) | TOPICAL | 0 refills | Status: CN
Start: 2018-01-07 — End: ?

## 2018-01-07 MED ORDER — VITS A AND D-WHITE PET-LANOLIN TP OINT
Freq: Two times a day (BID) | TOPICAL | 0 refills | Status: CN
Start: 2018-01-07 — End: ?

## 2018-01-07 MED ORDER — SULFAMETHOXAZOLE-TRIMETHOPRIM 800-160 MG PO TAB
1 | Freq: Two times a day (BID) | ORAL | 0 refills | Status: CP
Start: 2018-01-07 — End: ?
  Administered 2018-01-08 – 2018-01-10 (×5): 1 via ORAL

## 2018-01-07 MED ORDER — ERGOCALCIFEROL (VITAMIN D2) 50,000 UNIT PO CAP
50000 [IU] | ORAL | 0 refills | Status: CN
Start: 2018-01-07 — End: ?

## 2018-01-07 MED ORDER — PROMETHAZINE 25 MG PO TAB
25 mg | ORAL | 0 refills | Status: CN | PRN
Start: 2018-01-07 — End: ?

## 2018-01-07 MED ORDER — SELENIUM 200 MCG PO TAB
200 ug | Freq: Every day | ORAL | 0 refills | Status: DC
Start: 2018-01-07 — End: 2018-01-15
  Administered 2018-01-08 – 2018-01-15 (×8): 200 ug via ORAL

## 2018-01-07 MED ORDER — DEXTROSE 50 % IN WATER (D50W) IV SOLP
50 mL | INTRAVENOUS | 0 refills | Status: DC | PRN
Start: 2018-01-07 — End: 2018-01-15
  Administered 2018-01-07: 23:00:00 50 mL via INTRAVENOUS

## 2018-01-07 MED ORDER — NYSTATIN 100,000 UNIT/GRAM TP POWD
Freq: Two times a day (BID) | TOPICAL | 0 refills | Status: DC
Start: 2018-01-07 — End: 2018-01-15
  Administered 2018-01-08: 03:00:00 via TOPICAL

## 2018-01-07 MED ORDER — VITS A AND D-WHITE PET-LANOLIN TP OINT
Freq: Two times a day (BID) | TOPICAL | 0 refills | Status: DC
Start: 2018-01-07 — End: 2018-01-15
  Administered 2018-01-08 (×2): via TOPICAL

## 2018-01-07 MED ORDER — MV. MIN CMB#51-FA-K-Q10 100-350-5 MCG-MCG-MG PO CHEW
1 | Freq: Every day | ORAL | 0 refills | Status: DC
Start: 2018-01-07 — End: 2018-01-15
  Administered 2018-01-08 – 2018-01-15 (×8): 1 via ORAL

## 2018-01-07 MED ORDER — LIDOCAINE 5 % TP PTMD
1 | TOPICAL | 0 refills | Status: DC
Start: 2018-01-07 — End: 2018-01-15
  Administered 2018-01-09 – 2018-01-15 (×5): 1 via TOPICAL

## 2018-01-07 MED ORDER — SELENIUM 200 MCG PO TAB
200 ug | Freq: Every day | ORAL | 0 refills | Status: DC
Start: 2018-01-07 — End: 2018-06-29

## 2018-01-07 MED ORDER — CUPRIC CHLORIDE(#) INJ FOR PO 2MG/5ML
2 mg | Freq: Every day | ORAL | 0 refills | Status: CN
Start: 2018-01-07 — End: ?

## 2018-01-07 MED ORDER — IMS MIXTURE TEMPLATE
60 mg | Freq: Every day | ORAL | 0 refills | Status: CN
Start: 2018-01-07 — End: ?

## 2018-01-07 MED ORDER — LIPASE-PROTEASE-AMYLASE (CREON 12,000) 12,000-38,000 -60,000 UNIT PO CPDR
1 | ORAL_CAPSULE | Freq: Three times a day (TID) | ORAL | 1 refills | 30.00000 days | Status: AC
Start: 2018-01-07 — End: 2018-01-15

## 2018-01-07 MED ORDER — LIDOCAINE 5 % TP PTMD
1 | TOPICAL | 0 refills | Status: CN
Start: 2018-01-07 — End: ?

## 2018-01-07 MED ORDER — THIAMINE MONONITRATE (VIT B1) 100 MG PO TAB
100 mg | Freq: Every day | ORAL | 0 refills | Status: DC
Start: 2018-01-07 — End: 2018-01-15
  Administered 2018-01-08 – 2018-01-15 (×8): 100 mg via ORAL

## 2018-01-07 MED ORDER — DIPHENHYDRAMINE HCL 25 MG PO CAP
25 mg | ORAL | 0 refills | Status: DC | PRN
Start: 2018-01-07 — End: 2018-01-15

## 2018-01-07 MED ORDER — LOPERAMIDE 2 MG PO CAP
2 mg | Freq: Three times a day (TID) | ORAL | 0 refills | Status: DC
Start: 2018-01-07 — End: 2018-01-10
  Administered 2018-01-07 – 2018-01-10 (×9): 2 mg via ORAL

## 2018-01-07 MED ORDER — TRAMADOL 50 MG PO TAB
50 mg | ORAL_TABLET | ORAL | 0 refills | Status: AC | PRN
Start: 2018-01-07 — End: 2018-04-28

## 2018-01-07 MED ORDER — PROMETHAZINE 25 MG PO TAB
25 mg | ORAL | 0 refills | Status: DC | PRN
Start: 2018-01-07 — End: 2018-01-15
  Administered 2018-01-07 – 2018-01-15 (×10): 25 mg via ORAL

## 2018-01-07 MED ORDER — LOPERAMIDE 2 MG PO CAP
2 mg | Freq: Three times a day (TID) | ORAL | 0 refills | Status: CN
Start: 2018-01-07 — End: ?

## 2018-01-07 MED ORDER — MIDODRINE 10 MG PO TAB
10 mg | ORAL_TABLET | Freq: Two times a day (BID) | ORAL | 1 refills | Status: AC
Start: 2018-01-07 — End: 2018-04-28

## 2018-01-07 MED ORDER — LIDOCAINE 5 % TP PTMD
1 | Freq: Every day | TOPICAL | 0 refills | Status: CN
Start: 2018-01-07 — End: ?

## 2018-01-07 MED ORDER — SELENIUM 200 MCG PO TAB
200 ug | Freq: Every day | ORAL | 0 refills | Status: CN
Start: 2018-01-07 — End: ?

## 2018-01-07 MED ORDER — DICLOFENAC SODIUM 1 % TP GEL
4 g | Freq: Two times a day (BID) | TOPICAL | 0 refills | Status: DC
Start: 2018-01-07 — End: 2018-01-15
  Administered 2018-01-08: 03:00:00 4 g via TOPICAL

## 2018-01-07 MED ORDER — PANTOPRAZOLE 40 MG PO TBEC
40 mg | Freq: Every day | ORAL | 0 refills | Status: DC
Start: 2018-01-07 — End: 2018-01-15
  Administered 2018-01-08 – 2018-01-15 (×5): 40 mg via ORAL

## 2018-01-07 MED ORDER — EMU OIL 120ML
Freq: Two times a day (BID) | TOPICAL | 0 refills | Status: CN
Start: 2018-01-07 — End: ?

## 2018-01-07 MED ORDER — MIDODRINE 5 MG PO TAB
10 mg | Freq: Two times a day (BID) | ORAL | 0 refills | Status: DC
Start: 2018-01-07 — End: 2018-01-15
  Administered 2018-01-07 – 2018-01-15 (×16): 10 mg via ORAL

## 2018-01-07 MED ORDER — THIAMINE MONONITRATE (VIT B1) 100 MG PO TAB
100 mg | Freq: Every day | ORAL | 0 refills | Status: CN
Start: 2018-01-07 — End: ?

## 2018-01-07 MED ORDER — ERGOCALCIFEROL (VITAMIN D2) 50,000 UNIT PO CAP
50000 [IU] | ORAL | 0 refills | Status: DC
Start: 2018-01-07 — End: 2018-01-15
  Administered 2018-01-08 – 2018-01-15 (×4): 50000 [IU] via ORAL

## 2018-01-07 MED ADMIN — DEXTROSE 50 % IN WATER (D50W) IV SYRG [2365]: 50 mL | INTRAVENOUS | @ 20:00:00 | Stop: 2018-01-07 | NDC 00409751716

## 2018-01-08 ENCOUNTER — Inpatient Hospital Stay: Admit: 2018-01-08 | Discharge: 2018-01-08

## 2018-01-08 LAB — CBC AND DIFF
Lab: 2.3 M/UL — ABNORMAL LOW (ref 4.0–5.0)
Lab: 3.7 K/UL — ABNORMAL LOW (ref 4.5–11.0)

## 2018-01-08 LAB — COMPREHENSIVE METABOLIC PANEL: Lab: 139 MMOL/L — ABNORMAL LOW (ref >60)

## 2018-01-08 LAB — POC GLUCOSE: Lab: 58 mg/dL — ABNORMAL LOW (ref 70–100)

## 2018-01-08 MED ORDER — HEPARIN, PORCINE (PF) 5,000 UNIT/0.5 ML IJ SYRG
5000 [IU] | SUBCUTANEOUS | 0 refills | Status: DC
Start: 2018-01-08 — End: 2018-01-15
  Administered 2018-01-09 – 2018-01-15 (×18): 5000 [IU] via SUBCUTANEOUS

## 2018-01-08 MED ORDER — ~~LOC~~ CLOG DESTROYER(#) (ZENPEP 20,000 + SODIUM BICARB CAPSULE)
1 | GASTROSTOMY | 0 refills | Status: DC | PRN
Start: 2018-01-08 — End: 2018-01-15

## 2018-01-08 MED ORDER — DIATRIZOATE MEG-DIATRIZOAT SOD 66-10 % PO SOLN
15 mL | Freq: Once | GASTROSTOMY | 0 refills | Status: CP
Start: 2018-01-08 — End: ?
  Administered 2018-01-08: 14:00:00 15 mL via GASTROSTOMY

## 2018-01-08 MED ORDER — LIPASE-PROTEASE-AMYLASE (CREON 24,000) 24,000-76,000- 120,000 UNIT PO CPDR
2 | ORAL | 0 refills | Status: DC
Start: 2018-01-08 — End: 2018-01-15
  Administered 2018-01-08 – 2018-01-15 (×29): 2 via ORAL

## 2018-01-08 MED ORDER — LIPASE-PROTEASE-AMYLASE (CREON 24,000) 24,000-76,000- 120,000 UNIT PO CPDR
2 | Freq: Four times a day (QID) | ORAL | 0 refills | Status: DC
Start: 2018-01-08 — End: 2018-01-08

## 2018-01-09 LAB — CBC AND DIFF: Lab: 4.5 K/UL — ABNORMAL LOW (ref 4.5–11.0)

## 2018-01-09 LAB — PREALBUMIN

## 2018-01-09 LAB — POC GLUCOSE
Lab: 56 mg/dL — ABNORMAL LOW (ref 70–100)
Lab: 59 mg/dL — ABNORMAL LOW (ref 70–100)

## 2018-01-09 LAB — COMPREHENSIVE METABOLIC PANEL: Lab: 141 MMOL/L — ABNORMAL LOW (ref >60)

## 2018-01-09 LAB — MAGNESIUM: Lab: 1.7 mg/dL — ABNORMAL HIGH (ref 1.6–2.6)

## 2018-01-09 LAB — PHOSPHORUS: Lab: 3.9 mg/dL — ABNORMAL LOW (ref 2.0–4.5)

## 2018-01-09 MED ORDER — MAGNESIUM SULFATE IN D5W 1 GRAM/100 ML IV PGBK
1 g | INTRAVENOUS | 0 refills | Status: CP
Start: 2018-01-09 — End: ?
  Administered 2018-01-09 (×2): 1 g via INTRAVENOUS

## 2018-01-09 MED ORDER — POTASSIUM CHLORIDE 20 MEQ/15 ML PO LIQD
40 meq | Freq: Once | NASOGASTRIC | 0 refills | Status: CP
Start: 2018-01-09 — End: ?
  Administered 2018-01-09: 12:00:00 40 meq via NASOGASTRIC

## 2018-01-09 MED ORDER — ALTEPLASE 2 MG IK SOLR
2 mg | Freq: Once | INTRAMUSCULAR | 0 refills | Status: CP
Start: 2018-01-09 — End: ?
  Administered 2018-01-09: 19:00:00 2 mg via INTRAMUSCULAR

## 2018-01-09 MED ORDER — DEXTROSE 50 % IN WATER (D50W) IV SOLP
50 mL | Freq: Once | INTRAVENOUS | 0 refills | Status: DC
Start: 2018-01-09 — End: 2018-01-09

## 2018-01-10 LAB — COMPREHENSIVE METABOLIC PANEL: Lab: 138 MMOL/L — ABNORMAL LOW (ref >60)

## 2018-01-10 LAB — CBC AND DIFF: Lab: 4.5 K/UL — ABNORMAL LOW (ref >60)

## 2018-01-10 LAB — MAGNESIUM: Lab: 2 mg/dL — ABNORMAL HIGH (ref 1.6–2.6)

## 2018-01-10 LAB — PHOSPHORUS: Lab: 3.8 mg/dL — ABNORMAL LOW (ref 2.0–4.5)

## 2018-01-10 MED ORDER — FENTANYL CITRATE (PF) 50 MCG/ML IJ SOLN
12.5 ug | Freq: Once | INTRAVENOUS | 0 refills | Status: CP
Start: 2018-01-10 — End: ?
  Administered 2018-01-10: 12.5 ug via INTRAVENOUS

## 2018-01-10 MED ORDER — LOPERAMIDE 2 MG PO CAP
2 mg | Freq: Three times a day (TID) | ORAL | 0 refills | Status: DC | PRN
Start: 2018-01-10 — End: 2018-01-15
  Administered 2018-01-11 – 2018-01-15 (×12): 2 mg via ORAL

## 2018-01-11 LAB — COMPREHENSIVE METABOLIC PANEL: Lab: 137 MMOL/L — ABNORMAL LOW (ref 137–147)

## 2018-01-11 LAB — CBC AND DIFF: Lab: 4.1 K/UL — ABNORMAL LOW (ref 4.5–11.0)

## 2018-01-11 LAB — MAGNESIUM: Lab: 1.8 mg/dL — ABNORMAL LOW (ref 1.6–2.6)

## 2018-01-11 LAB — PHOSPHORUS: Lab: 3.7 mg/dL — ABNORMAL HIGH (ref >60)

## 2018-01-12 LAB — COMPREHENSIVE METABOLIC PANEL: Lab: 135 MMOL/L — ABNORMAL LOW (ref >60)

## 2018-01-12 LAB — MAGNESIUM: Lab: 1.9 mg/dL — ABNORMAL LOW (ref 1.6–2.6)

## 2018-01-12 LAB — PHOSPHORUS: Lab: 3.8 mg/dL — ABNORMAL LOW (ref >60)

## 2018-01-12 LAB — CBC: Lab: 4.7 K/UL — ABNORMAL HIGH (ref 4.5–11.0)

## 2018-01-12 MED ORDER — MELATONIN 3 MG PO TAB
3 mg | Freq: Every evening | ORAL | 0 refills | Status: DC | PRN
Start: 2018-01-12 — End: 2018-01-15

## 2018-01-13 LAB — CBC: Lab: 4.4 K/UL — ABNORMAL LOW (ref 4.5–11.0)

## 2018-01-13 LAB — COMPREHENSIVE METABOLIC PANEL: Lab: 138 MMOL/L — ABNORMAL HIGH (ref >60)

## 2018-01-13 LAB — MAGNESIUM: Lab: 1.8 mg/dL — ABNORMAL LOW (ref >60)

## 2018-01-13 LAB — PHOSPHORUS: Lab: 3.7 mg/dL — ABNORMAL LOW (ref 2.0–4.5)

## 2018-01-14 LAB — MAGNESIUM: Lab: 1.7 mg/dL — ABNORMAL LOW (ref >60)

## 2018-01-14 LAB — CBC: Lab: 4.8 K/UL — ABNORMAL LOW (ref 4.5–11.0)

## 2018-01-14 LAB — COMPREHENSIVE METABOLIC PANEL: Lab: 137 MMOL/L — ABNORMAL LOW (ref >60)

## 2018-01-14 LAB — PHOSPHORUS: Lab: 3.2 mg/dL — ABNORMAL LOW (ref >60)

## 2018-01-14 MED ORDER — FUROSEMIDE 40 MG PO TAB
40 mg | Freq: Every day | ORAL | 0 refills | Status: DC
Start: 2018-01-14 — End: 2018-01-15
  Administered 2018-01-15: 15:00:00 40 mg via ORAL

## 2018-01-15 ENCOUNTER — Encounter: Admit: 2018-01-15 | Discharge: 2018-01-15

## 2018-01-15 ENCOUNTER — Inpatient Hospital Stay
Admission: TF | Admit: 2018-01-07 | Discharge: 2018-01-15 | Disposition: A | Discharge status: Home Health | Payer: Medicaid Other | Source: Intra-hospital

## 2018-01-15 DIAGNOSIS — N183 Chronic kidney disease, stage 3 (moderate): ICD-10-CM

## 2018-01-15 DIAGNOSIS — Z6827 Body mass index (BMI) 27.0-27.9, adult: ICD-10-CM

## 2018-01-15 DIAGNOSIS — E43 Unspecified severe protein-calorie malnutrition: ICD-10-CM

## 2018-01-15 DIAGNOSIS — K911 Postgastric surgery syndromes: Principal | ICD-10-CM

## 2018-01-15 DIAGNOSIS — E162 Hypoglycemia, unspecified: ICD-10-CM

## 2018-01-15 DIAGNOSIS — K7581 Nonalcoholic steatohepatitis (NASH): ICD-10-CM

## 2018-01-15 DIAGNOSIS — Z9049 Acquired absence of other specified parts of digestive tract: ICD-10-CM

## 2018-01-15 DIAGNOSIS — N390 Urinary tract infection, site not specified: ICD-10-CM

## 2018-01-15 DIAGNOSIS — D649 Anemia, unspecified: ICD-10-CM

## 2018-01-15 DIAGNOSIS — Z9884 Bariatric surgery status: ICD-10-CM

## 2018-01-15 DIAGNOSIS — K7460 Unspecified cirrhosis of liver: ICD-10-CM

## 2018-01-15 DIAGNOSIS — N179 Acute kidney failure, unspecified: ICD-10-CM

## 2018-01-15 DIAGNOSIS — B9620 Unspecified Escherichia coli [E. coli] as the cause of diseases classified elsewhere: ICD-10-CM

## 2018-01-15 DIAGNOSIS — R64 Cachexia: ICD-10-CM

## 2018-01-15 LAB — MAGNESIUM: Lab: 1.7 mg/dL — ABNORMAL LOW (ref >60)

## 2018-01-15 LAB — PHOSPHORUS: Lab: 3 mg/dL — ABNORMAL LOW (ref 2.0–4.5)

## 2018-01-15 LAB — COMPREHENSIVE METABOLIC PANEL: Lab: 137 MMOL/L — ABNORMAL LOW (ref 137–147)

## 2018-01-15 LAB — CBC: Lab: 4.7 10*3/uL — ABNORMAL LOW (ref >60)

## 2018-01-15 LAB — 25-OH VITAMIN D (D2 + D3): Lab: 20 ng/mL — ABNORMAL LOW (ref 30–80)

## 2018-01-15 MED ORDER — FUROSEMIDE 40 MG PO TAB
40 mg | ORAL_TABLET | Freq: Every morning | ORAL | 1 refills | 90.00000 days | Status: AC
Start: 2018-01-15 — End: 2018-10-25
  Filled 2018-01-15 (×2): qty 45, 45d supply, fill #1

## 2018-01-15 MED ORDER — LIPASE-PROTEASE-AMYLASE (CREON 24,000) 24,000-76,000- 120,000 UNIT PO CPDR
2 | ORAL_CAPSULE | ORAL | 1 refills | 30.00000 days | Status: AC
Start: 2018-01-15 — End: ?
  Filled 2018-01-15 (×2): qty 360, 6d supply, fill #1

## 2018-01-15 MED ORDER — COPPER GLUCONATE 2 MG PO TAB
1 | ORAL_TABLET | Freq: Every day | ORAL | 0 refills | Status: AC
Start: 2018-01-15 — End: ?

## 2018-01-15 MED ORDER — ZINC SULFATE 220 (50) MG PO CAP
220 mg | ORAL_CAPSULE | Freq: Every day | ORAL | 1 refills | Status: AC
Start: 2018-01-15 — End: ?

## 2018-01-18 ENCOUNTER — Encounter: Admit: 2018-01-18 | Discharge: 2018-01-18

## 2018-01-18 MED FILL — LIPASE-PROTEASE-AMYLASE (CREON 24,000) 24,000-76,000- 120,000 UNIT PO CPDR: 24000-76000-120000 units | ORAL | 24 days supply | Qty: 360 | Fill #2 | Status: CP

## 2018-01-19 ENCOUNTER — Encounter: Admit: 2018-01-19 | Discharge: 2018-01-19

## 2018-01-21 ENCOUNTER — Encounter: Admit: 2018-01-21 | Discharge: 2018-01-21

## 2018-01-25 ENCOUNTER — Encounter: Admit: 2018-01-25 | Discharge: 2018-01-25

## 2018-01-28 ENCOUNTER — Encounter: Admit: 2018-01-28 | Discharge: 2018-01-28

## 2018-02-01 ENCOUNTER — Encounter: Admit: 2018-02-01 | Discharge: 2018-02-01

## 2018-02-01 DIAGNOSIS — E46 Unspecified protein-calorie malnutrition: Principal | ICD-10-CM

## 2018-02-03 ENCOUNTER — Encounter: Admit: 2018-02-03 | Discharge: 2018-02-03

## 2018-02-03 DIAGNOSIS — E46 Unspecified protein-calorie malnutrition: Principal | ICD-10-CM

## 2018-02-04 ENCOUNTER — Ambulatory Visit: Admit: 2018-02-04 | Discharge: 2018-02-04 | Payer: Medicaid Other

## 2018-02-04 ENCOUNTER — Ambulatory Visit: Admit: 2018-02-04 | Discharge: 2018-02-05 | Payer: Medicaid Other

## 2018-02-04 ENCOUNTER — Encounter: Admit: 2018-02-04 | Discharge: 2018-02-04

## 2018-02-04 DIAGNOSIS — M1990 Unspecified osteoarthritis, unspecified site: Principal | ICD-10-CM

## 2018-02-04 DIAGNOSIS — E43 Unspecified severe protein-calorie malnutrition: ICD-10-CM

## 2018-02-04 DIAGNOSIS — E46 Unspecified protein-calorie malnutrition: Principal | ICD-10-CM

## 2018-02-04 DIAGNOSIS — E669 Obesity, unspecified: ICD-10-CM

## 2018-02-04 DIAGNOSIS — K760 Fatty (change of) liver, not elsewhere classified: ICD-10-CM

## 2018-02-04 LAB — CBC AND DIFF
Lab: 0 10*3/uL (ref 0–0.20)
Lab: 0 10*3/uL (ref 0–0.45)
Lab: 0.2 10*3/uL (ref 0–0.80)
Lab: 1.5 10*3/uL (ref 1.0–4.8)
Lab: 2.9 M/UL — ABNORMAL LOW (ref 4.0–5.0)
Lab: 20 % — ABNORMAL HIGH (ref 11–15)
Lab: 27 % — ABNORMAL LOW (ref 36–45)
Lab: 3.6 10*3/uL — ABNORMAL LOW (ref 4.5–11.0)
Lab: 31 pg (ref 26–34)

## 2018-02-04 LAB — COMPREHENSIVE METABOLIC PANEL
Lab: 113 MMOL/L — ABNORMAL HIGH (ref 98–110)
Lab: 143 MMOL/L (ref 137–147)
Lab: 3.6 MMOL/L (ref 3.5–5.1)

## 2018-02-08 LAB — VITAMIN A: Lab: 11 — ABNORMAL LOW

## 2018-02-09 ENCOUNTER — Encounter: Admit: 2018-02-09 | Discharge: 2018-02-09

## 2018-02-13 ENCOUNTER — Encounter: Admit: 2018-02-13 | Discharge: 2018-02-13

## 2018-02-13 ENCOUNTER — Encounter: Admit: 2018-02-13 | Discharge: 2018-02-14

## 2018-02-13 MED ORDER — ACETAMINOPHEN 325 MG PO TAB
650 mg | ORAL | 0 refills | Status: DC | PRN
Start: 2018-02-13 — End: 2018-02-15

## 2018-02-13 MED ORDER — ENOXAPARIN 40 MG/0.4 ML SC SYRG
40 mg | Freq: Every day | SUBCUTANEOUS | 0 refills | Status: DC
Start: 2018-02-13 — End: 2018-02-14

## 2018-02-13 MED ORDER — ERGOCALCIFEROL (VITAMIN D2) 50,000 UNIT PO CAP
50000 [IU] | ORAL | 0 refills | Status: DC
Start: 2018-02-13 — End: 2018-02-15
  Administered 2018-02-15: 18:00:00 50000 [IU] via ORAL

## 2018-02-13 MED ORDER — MV. MIN CMB#51-FA-K-Q10 100-350-5 MCG-MCG-MG PO CHEW
1 | Freq: Every day | ORAL | 0 refills | Status: DC
Start: 2018-02-13 — End: 2018-02-15
  Administered 2018-02-14 – 2018-02-15 (×2): 1 via ORAL

## 2018-02-13 MED ORDER — SELENIUM 200 MCG PO TAB
200 ug | Freq: Every day | ORAL | 0 refills | Status: DC
Start: 2018-02-13 — End: 2018-02-15
  Administered 2018-02-14 – 2018-02-15 (×2): 200 ug via ORAL

## 2018-02-13 MED ORDER — ZINC SULFATE 220 (50) MG PO CAP
220 mg | Freq: Every day | ORAL | 0 refills | Status: DC
Start: 2018-02-13 — End: 2018-02-15
  Administered 2018-02-14 – 2018-02-15 (×2): 220 mg via ORAL

## 2018-02-13 MED ORDER — LIPASE-PROTEASE-AMYLASE (CREON 24,000) 24,000-76,000- 120,000 UNIT PO CPDR
2 | ORAL | 0 refills | Status: DC
Start: 2018-02-13 — End: 2018-02-15
  Administered 2018-02-14 – 2018-02-15 (×8): 2 via ORAL

## 2018-02-13 MED ORDER — THIAMINE MONONITRATE (VIT B1) 100 MG PO TAB
100 mg | Freq: Every day | ORAL | 0 refills | Status: DC
Start: 2018-02-13 — End: 2018-02-15
  Administered 2018-02-14 – 2018-02-15 (×2): 100 mg via ORAL

## 2018-02-13 MED ORDER — MULTIVIT-IRON-FA-CALCIUM-MINS 9 MG IRON-400 MCG PO TAB
1 | Freq: Two times a day (BID) | ORAL | 0 refills | Status: DC
Start: 2018-02-13 — End: 2018-02-15
  Administered 2018-02-14 – 2018-02-15 (×3): 1 via ORAL

## 2018-02-14 ENCOUNTER — Encounter: Admit: 2018-02-14 | Discharge: 2018-02-14

## 2018-02-14 DIAGNOSIS — R0602 Shortness of breath: ICD-10-CM

## 2018-02-14 LAB — CBC AND DIFF
Lab: 0 10*3/uL (ref 0–0.20)
Lab: 0 10*3/uL (ref 0–0.45)
Lab: 0.2 10*3/uL (ref 0–0.80)
Lab: 1 % (ref 0–2)
Lab: 1 % (ref 0–5)
Lab: 1.5 10*3/uL (ref 1.0–4.8)
Lab: 2 10*3/uL (ref 1.8–7.0)
Lab: 2.7 M/UL — ABNORMAL LOW (ref 4.0–5.0)
Lab: 26 % — ABNORMAL LOW (ref 36–45)
Lab: 32 g/dL (ref 32.0–36.0)
Lab: 32 pg — ABNORMAL LOW (ref 26–34)
Lab: 40 % (ref >60)
Lab: 7 % (ref >60)
Lab: 8.8 g/dL — ABNORMAL LOW (ref 12.0–15.0)
Lab: 80 10*3/uL — ABNORMAL LOW (ref 150–400)

## 2018-02-14 LAB — COMPREHENSIVE METABOLIC PANEL
Lab: 11 U/L (ref 7–56)
Lab: 114 MMOL/L — ABNORMAL HIGH (ref 98–110)
Lab: 142 MMOL/L (ref 137–147)
Lab: 30 mg/dL — ABNORMAL HIGH (ref 7–25)
Lab: 4 % (ref 3–12)
Lab: 4 MMOL/L (ref 3.5–5.1)
Lab: 69 mg/dL — ABNORMAL LOW (ref 70–100)

## 2018-02-14 LAB — IRON + BINDING CAPACITY + %SAT+ FERRITIN
Lab: 34 ug/dL — ABNORMAL LOW (ref 50–160)
Lab: 498 ng/mL — ABNORMAL HIGH (ref 10–200)

## 2018-02-14 LAB — LACTIC ACID(LACTATE): Lab: 0.7 MMOL/L (ref 0.5–2.0)

## 2018-02-14 LAB — PTT (APTT): Lab: 32 s — ABNORMAL HIGH (ref 24.0–36.5)

## 2018-02-14 LAB — MAGNESIUM: Lab: 1.6 mg/dL (ref 1.6–2.6)

## 2018-02-14 MED ORDER — MAGNESIUM SULFATE IN D5W 1 GRAM/100 ML IV PGBK
1 g | INTRAVENOUS | 0 refills | Status: CP
Start: 2018-02-14 — End: ?
  Administered 2018-02-14 (×2): 1 g via INTRAVENOUS

## 2018-02-14 MED ORDER — AMITRIPTYLINE(#)/GABAPENTIN/EMU OIL 4/4/10%IN HRT TOPICAL CRM 100G
TOPICAL | 0 refills | Status: DC
Start: 2018-02-14 — End: 2018-02-14

## 2018-02-14 MED ORDER — FUROSEMIDE 10 MG/ML IJ SOLN
20 mg | Freq: Once | INTRAVENOUS | 0 refills | Status: CP
Start: 2018-02-14 — End: ?
  Administered 2018-02-14: 08:00:00 20 mg via INTRAVENOUS

## 2018-02-14 MED ORDER — ONDANSETRON HCL (PF) 4 MG/2 ML IJ SOLN
4-8 mg | INTRAVENOUS | 0 refills | Status: DC | PRN
Start: 2018-02-14 — End: 2018-02-15
  Administered 2018-02-15 (×3): 4 mg via INTRAVENOUS

## 2018-02-14 MED ORDER — FUROSEMIDE 10 MG/ML IJ SOLN
40 mg | Freq: Once | INTRAVENOUS | 0 refills | Status: DC
Start: 2018-02-14 — End: 2018-02-14

## 2018-02-14 MED ORDER — ONDANSETRON HCL (PF) 4 MG/2 ML IJ SOLN
4 mg | INTRAVENOUS | 0 refills | Status: DC | PRN
Start: 2018-02-14 — End: 2018-02-15
  Administered 2018-02-14 (×3): 4 mg via INTRAVENOUS

## 2018-02-14 MED ORDER — FUROSEMIDE 10 MG/ML IJ SOLN
20 mg | Freq: Once | INTRAVENOUS | 0 refills | Status: CP
Start: 2018-02-14 — End: ?
  Administered 2018-02-14: 15:00:00 20 mg via INTRAVENOUS

## 2018-02-14 MED ORDER — AMITRIPTYLINE(#)/GABAPENTIN/EMU OIL 4/4/10%IN HRT TOPICAL CRM 50G
TOPICAL | 0 refills | Status: DC
Start: 2018-02-14 — End: 2018-02-15
  Administered 2018-02-14: 16:00:00 via TOPICAL

## 2018-02-15 ENCOUNTER — Encounter: Admit: 2018-02-15 | Discharge: 2018-02-15

## 2018-02-15 ENCOUNTER — Inpatient Hospital Stay: Admit: 2018-02-15 | Discharge: 2018-02-15

## 2018-02-15 ENCOUNTER — Observation Stay: Admit: 2018-02-14 | Discharge: 2018-02-14

## 2018-02-15 ENCOUNTER — Inpatient Hospital Stay
Admit: 2018-02-14 | Discharge: 2018-02-15 | Disposition: A | Discharge status: Home Health | Payer: Medicaid Other | Source: Other Acute Inpatient Hospital

## 2018-02-15 DIAGNOSIS — K9589 Other complications of other bariatric procedure: ICD-10-CM

## 2018-02-15 DIAGNOSIS — Z6827 Body mass index (BMI) 27.0-27.9, adult: ICD-10-CM

## 2018-02-15 DIAGNOSIS — R0602 Shortness of breath: ICD-10-CM

## 2018-02-15 DIAGNOSIS — D638 Anemia in other chronic diseases classified elsewhere: ICD-10-CM

## 2018-02-15 DIAGNOSIS — R600 Localized edema: ICD-10-CM

## 2018-02-15 DIAGNOSIS — K766 Portal hypertension: ICD-10-CM

## 2018-02-15 DIAGNOSIS — K7469 Other cirrhosis of liver: Principal | ICD-10-CM

## 2018-02-15 DIAGNOSIS — R188 Other ascites: ICD-10-CM

## 2018-02-15 DIAGNOSIS — D539 Nutritional anemia, unspecified: ICD-10-CM

## 2018-02-15 DIAGNOSIS — E43 Unspecified severe protein-calorie malnutrition: ICD-10-CM

## 2018-02-15 DIAGNOSIS — R64 Cachexia: ICD-10-CM

## 2018-02-15 DIAGNOSIS — K7581 Nonalcoholic steatohepatitis (NASH): ICD-10-CM

## 2018-02-15 DIAGNOSIS — K7290 Hepatic failure, unspecified without coma: ICD-10-CM

## 2018-02-15 DIAGNOSIS — D6959 Other secondary thrombocytopenia: ICD-10-CM

## 2018-02-15 DIAGNOSIS — K911 Postgastric surgery syndromes: ICD-10-CM

## 2018-02-15 DIAGNOSIS — R32 Unspecified urinary incontinence: ICD-10-CM

## 2018-02-15 LAB — COMPREHENSIVE METABOLIC PANEL: Lab: 140 MMOL/L — ABNORMAL LOW (ref >60)

## 2018-02-15 LAB — CBC AND DIFF
Lab: 4.3 K/UL — ABNORMAL LOW (ref >60)
Lab: 8 g/dL — ABNORMAL LOW (ref >60)
Lab: 96 FL — ABNORMAL LOW (ref 80–100)

## 2018-02-15 LAB — GRAM STAIN

## 2018-02-15 LAB — PROTIME INR (PT): Lab: 1.3 M/UL — ABNORMAL HIGH (ref 0.8–1.2)

## 2018-02-15 LAB — PERITONEAL FLUID GLUCOSE: Lab: 77 mg/dL (ref 70–100)

## 2018-02-15 LAB — PERITONEAL FLUID ALBUMIN

## 2018-02-15 LAB — MAGNESIUM: Lab: 1.8 mg/dL — ABNORMAL LOW (ref 1.6–2.6)

## 2018-02-15 MED ORDER — LOPERAMIDE 2 MG PO CAP
2 mg | Freq: Once | ORAL | 0 refills | Status: CP
Start: 2018-02-15 — End: ?
  Administered 2018-02-15: 06:00:00 2 mg via ORAL

## 2018-02-15 MED ORDER — LOPERAMIDE 2 MG PO CAP
2 mg | Freq: Once | ORAL | 0 refills | Status: CP
Start: 2018-02-15 — End: ?
  Administered 2018-02-15: 19:00:00 2 mg via ORAL

## 2018-02-15 MED ORDER — SPIRONOLACTONE 100 MG PO TAB
100 mg | ORAL_TABLET | Freq: Every day | ORAL | 3 refills | 90.00000 days | Status: AC
Start: 2018-02-15 — End: 2018-06-29
  Filled 2018-02-15 (×2): qty 90, 90d supply, fill #1

## 2018-02-16 LAB — CELL COUNT W/DIFF-FLUIDS: Lab: 100 /uL

## 2018-02-17 ENCOUNTER — Encounter: Admit: 2018-02-17 | Discharge: 2018-02-17

## 2018-02-20 LAB — CULTURE-WOUND/TISSUE/FLUID(AEROBIC ONLY)W/SENSITIVITY

## 2018-03-02 ENCOUNTER — Encounter: Admit: 2018-03-02 | Discharge: 2018-03-02

## 2018-03-02 MED ORDER — CREON 12,000-38,000 -60,000 UNIT PO CPDR
ORAL_CAPSULE | Freq: Three times a day (TID) | 1 refills
Start: 2018-03-02 — End: ?

## 2018-03-11 ENCOUNTER — Ambulatory Visit: Admit: 2018-03-11 | Discharge: 2018-03-12 | Payer: Medicaid Other

## 2018-03-11 ENCOUNTER — Ambulatory Visit: Admit: 2018-03-11 | Discharge: 2018-03-11 | Payer: Medicaid Other

## 2018-03-11 ENCOUNTER — Encounter: Admit: 2018-03-11 | Discharge: 2018-03-11

## 2018-03-11 DIAGNOSIS — M1990 Unspecified osteoarthritis, unspecified site: Principal | ICD-10-CM

## 2018-03-11 DIAGNOSIS — K760 Fatty (change of) liver, not elsewhere classified: ICD-10-CM

## 2018-03-11 DIAGNOSIS — E43 Unspecified severe protein-calorie malnutrition: ICD-10-CM

## 2018-03-11 DIAGNOSIS — E46 Unspecified protein-calorie malnutrition: Principal | ICD-10-CM

## 2018-03-11 DIAGNOSIS — E669 Obesity, unspecified: ICD-10-CM

## 2018-03-11 LAB — COMPREHENSIVE METABOLIC PANEL
Lab: 0.5 mg/dL (ref 0.3–1.2)
Lab: 0.6 mg/dL (ref 0.4–1.00)
Lab: 111 MMOL/L — ABNORMAL HIGH (ref 98–110)
Lab: 13 U/L (ref 7–56)
Lab: 140 MMOL/L — ABNORMAL LOW (ref >40)
Lab: 2.4 g/dL — ABNORMAL LOW (ref 3.5–5.0)
Lab: 24 U/L (ref 7–40)
Lab: 25 MMOL/L (ref 21–30)
Lab: 25 mg/dL (ref 7–25)
Lab: 4 (ref 3–12)
Lab: 4.6 MMOL/L (ref <100)
Lab: 63 U/L (ref 25–110)
Lab: 7.1 g/dL (ref 6.0–8.0)
Lab: 76 mg/dL (ref 70–100)
Lab: 8.2 mg/dL — ABNORMAL LOW (ref 8.5–10.6)
Lab: >60 mL/min (ref >60)
Lab: >60 mL/min (ref >60)

## 2018-03-11 LAB — LIPID PROFILE
Lab: 58 mg/dL (ref <150)
Lab: 69 mg/dL (ref <200)

## 2018-03-12 DIAGNOSIS — E46 Unspecified protein-calorie malnutrition: Principal | ICD-10-CM

## 2018-04-28 ENCOUNTER — Ambulatory Visit: Admit: 2018-04-28 | Discharge: 2018-04-28 | Payer: Medicaid Other

## 2018-04-28 ENCOUNTER — Ambulatory Visit: Admit: 2018-04-28 | Discharge: 2018-04-28

## 2018-04-28 ENCOUNTER — Encounter: Admit: 2018-04-28 | Discharge: 2018-04-28

## 2018-04-28 DIAGNOSIS — E46 Unspecified protein-calorie malnutrition: Principal | ICD-10-CM

## 2018-04-28 DIAGNOSIS — R188 Other ascites: ICD-10-CM

## 2018-04-28 DIAGNOSIS — E43 Unspecified severe protein-calorie malnutrition: ICD-10-CM

## 2018-04-28 DIAGNOSIS — K760 Fatty (change of) liver, not elsewhere classified: ICD-10-CM

## 2018-04-28 DIAGNOSIS — E669 Obesity, unspecified: ICD-10-CM

## 2018-04-28 DIAGNOSIS — M1990 Unspecified osteoarthritis, unspecified site: Principal | ICD-10-CM

## 2018-04-28 LAB — COMPREHENSIVE METABOLIC PANEL
Lab: 0.5 mg/dL (ref 0.3–1.2)
Lab: 0.7 mg/dL (ref 0.4–1.00)
Lab: 108 MMOL/L (ref 98–110)
Lab: 136 MMOL/L — ABNORMAL LOW (ref 137–147)
Lab: 2.8 g/dL — ABNORMAL LOW (ref 3.5–5.0)
Lab: 23 MMOL/L (ref 21–30)
Lab: 23 U/L (ref 7–40)
Lab: 23 mg/dL (ref 7–25)
Lab: 4.8 MMOL/L (ref 3.5–5.1)
Lab: 5 (ref 3–12)
Lab: 62 U/L (ref 25–110)
Lab: 74 mg/dL (ref 70–100)
Lab: 8.4 mg/dL — ABNORMAL LOW (ref 8.5–10.6)
Lab: 9 U/L (ref 7–56)
Lab: >60 mL/min (ref >60)
Lab: >60 mL/min (ref >60)

## 2018-04-28 LAB — 25-OH VITAMIN D (D2 + D3): Lab: 13 ng/mL — ABNORMAL LOW (ref 30–80)

## 2018-04-29 ENCOUNTER — Encounter: Admit: 2018-04-29 | Discharge: 2018-04-29

## 2018-05-01 LAB — VITAMIN A: Lab: 26 — ABNORMAL LOW

## 2018-05-04 ENCOUNTER — Encounter: Admit: 2018-05-04 | Discharge: 2018-05-04

## 2018-05-15 ENCOUNTER — Encounter: Admit: 2018-05-15 | Discharge: 2018-05-15

## 2018-05-18 MED ORDER — VITAMIN D2 50,000 UNIT PO CAP
ORAL_CAPSULE | 1 refills
Start: 2018-05-18 — End: ?

## 2018-06-14 ENCOUNTER — Encounter: Admit: 2018-06-14 | Discharge: 2018-06-14

## 2018-06-28 ENCOUNTER — Encounter: Admit: 2018-06-28 | Discharge: 2018-06-28

## 2018-06-29 ENCOUNTER — Ambulatory Visit: Admit: 2018-06-29 | Discharge: 2018-06-29 | Payer: Medicaid Other

## 2018-06-29 ENCOUNTER — Encounter: Admit: 2018-06-29 | Discharge: 2018-06-29

## 2018-06-29 DIAGNOSIS — K760 Fatty (change of) liver, not elsewhere classified: ICD-10-CM

## 2018-06-29 DIAGNOSIS — M1990 Unspecified osteoarthritis, unspecified site: Principal | ICD-10-CM

## 2018-06-29 DIAGNOSIS — K7460 Unspecified cirrhosis of liver: Principal | ICD-10-CM

## 2018-06-29 DIAGNOSIS — E43 Unspecified severe protein-calorie malnutrition: ICD-10-CM

## 2018-06-29 DIAGNOSIS — E669 Obesity, unspecified: ICD-10-CM

## 2018-06-29 DIAGNOSIS — Z0000 Encounter for general adult medical examination without abnormal findings: ICD-10-CM

## 2018-06-29 DIAGNOSIS — R188 Other ascites: ICD-10-CM

## 2018-06-29 LAB — COMPREHENSIVE METABOLIC PANEL
Lab: 0.7 mg/dL — ABNORMAL LOW (ref 0.4–1.00)
Lab: 140 MMOL/L (ref 137–147)
Lab: 4.3 MMOL/L (ref 3.5–5.1)
Lab: >60 mL/min (ref >60)

## 2018-06-29 LAB — CBC AND DIFF
Lab: 0 10*3/uL (ref 0–0.20)
Lab: 0 10*3/uL (ref 0–0.45)
Lab: 0.2 10*3/uL (ref 0–0.80)
Lab: 1 % (ref >60)
Lab: 1.4 10*3/uL (ref 1.0–4.8)
Lab: 1.5 10*3/uL — ABNORMAL LOW (ref 1.8–7.0)
Lab: 10 g/dL — ABNORMAL LOW (ref 12.0–15.0)
Lab: 101 FL — ABNORMAL HIGH (ref 80–100)
Lab: 116 K/UL — ABNORMAL LOW (ref 150–400)
Lab: 13 % — ABNORMAL LOW (ref 11–15)
Lab: 3 M/UL — ABNORMAL LOW (ref 4.0–5.0)
Lab: 3.2 10*3/uL — ABNORMAL LOW (ref 4.5–11.0)
Lab: 32 g/dL (ref 32.0–36.0)
Lab: 33 pg (ref 26–34)
Lab: 44 % (ref 24–44)
Lab: 47 % (ref 41–77)
Lab: 7 % (ref 4–12)
Lab: 9.3 FL (ref 7–11)

## 2018-06-29 LAB — PROTIME INR (PT): Lab: 1.1 (ref 0.8–1.2)

## 2018-06-29 LAB — ALPHA FETO PROTEIN (AFP): Lab: 1.8 ng/mL (ref 0.0–15.0)

## 2018-06-30 ENCOUNTER — Ambulatory Visit: Admit: 2018-06-29 | Discharge: 2018-06-30 | Payer: Medicaid Other

## 2018-06-30 ENCOUNTER — Encounter: Admit: 2018-06-30 | Discharge: 2018-06-30

## 2018-06-30 DIAGNOSIS — E559 Vitamin D deficiency, unspecified: ICD-10-CM

## 2018-06-30 DIAGNOSIS — K219 Gastro-esophageal reflux disease without esophagitis: ICD-10-CM

## 2018-06-30 DIAGNOSIS — R188 Other ascites: ICD-10-CM

## 2018-06-30 DIAGNOSIS — K7290 Hepatic failure, unspecified without coma: Principal | ICD-10-CM

## 2018-06-30 DIAGNOSIS — K7460 Unspecified cirrhosis of liver: Principal | ICD-10-CM

## 2018-06-30 DIAGNOSIS — K766 Portal hypertension: ICD-10-CM

## 2018-06-30 DIAGNOSIS — E43 Unspecified severe protein-calorie malnutrition: ICD-10-CM

## 2018-06-30 DIAGNOSIS — K760 Fatty (change of) liver, not elsewhere classified: Secondary | ICD-10-CM

## 2018-06-30 MED ORDER — SPIRONOLACTONE 50 MG PO TAB
50 mg | ORAL_TABLET | Freq: Every day | ORAL | 3 refills | 90.00000 days | Status: AC
Start: 2018-06-30 — End: 2018-10-25

## 2018-07-08 ENCOUNTER — Encounter: Admit: 2018-07-08 | Discharge: 2018-07-08

## 2018-07-08 DIAGNOSIS — R7989 Other specified abnormal findings of blood chemistry: ICD-10-CM

## 2018-07-08 DIAGNOSIS — E43 Unspecified severe protein-calorie malnutrition: ICD-10-CM

## 2018-07-08 DIAGNOSIS — K7460 Unspecified cirrhosis of liver: Principal | ICD-10-CM

## 2018-07-08 DIAGNOSIS — K760 Fatty (change of) liver, not elsewhere classified: ICD-10-CM

## 2018-07-08 DIAGNOSIS — Z0000 Encounter for general adult medical examination without abnormal findings: ICD-10-CM

## 2018-07-13 ENCOUNTER — Encounter: Admit: 2018-07-13 | Discharge: 2018-07-13

## 2018-07-13 DIAGNOSIS — K7290 Hepatic failure, unspecified without coma: Principal | ICD-10-CM

## 2018-07-13 LAB — BASIC METABOLIC PANEL
Lab: 113 — ABNORMAL HIGH (ref 98–107)
Lab: 140
Lab: 20
Lab: 7
Lab: 7.7 — ABNORMAL LOW (ref 8.4–10.2)
Lab: 85
Lab: 0.7
Lab: 24
Lab: 3.9
Lab: 70

## 2018-07-16 ENCOUNTER — Encounter: Admit: 2018-07-16 | Discharge: 2018-07-16

## 2018-07-20 ENCOUNTER — Encounter: Admit: 2018-07-20 | Discharge: 2018-07-20

## 2018-10-21 ENCOUNTER — Encounter: Admit: 2018-10-21 | Discharge: 2018-10-21

## 2018-10-22 ENCOUNTER — Encounter: Admit: 2018-10-22 | Discharge: 2018-10-22

## 2018-10-25 ENCOUNTER — Encounter: Admit: 2018-10-25 | Discharge: 2018-10-25

## 2018-10-25 DIAGNOSIS — R188 Other ascites: ICD-10-CM

## 2018-10-25 DIAGNOSIS — K7460 Unspecified cirrhosis of liver: Principal | ICD-10-CM

## 2018-10-25 DIAGNOSIS — Z0000 Encounter for general adult medical examination without abnormal findings: ICD-10-CM

## 2018-10-25 DIAGNOSIS — R7989 Other specified abnormal findings of blood chemistry: ICD-10-CM

## 2018-10-25 MED ORDER — FUROSEMIDE 40 MG PO TAB
40 mg | ORAL_TABLET | ORAL | 0 refills | 90.00000 days | Status: AC
Start: 2018-10-25 — End: ?

## 2018-10-29 ENCOUNTER — Encounter: Admit: 2018-10-29 | Discharge: 2018-10-29

## 2018-10-29 DIAGNOSIS — R188 Other ascites: ICD-10-CM

## 2018-10-29 DIAGNOSIS — K7460 Unspecified cirrhosis of liver: Principal | ICD-10-CM

## 2018-10-29 DIAGNOSIS — R7989 Other specified abnormal findings of blood chemistry: ICD-10-CM

## 2018-10-29 DIAGNOSIS — Z0000 Encounter for general adult medical examination without abnormal findings: ICD-10-CM

## 2018-10-29 LAB — CBC AND DIFF
Lab: 10 — ABNORMAL LOW (ref 12.0–16)
Lab: 29 — ABNORMAL LOW (ref 30.0–34.0)
Lab: 35 — ABNORMAL LOW (ref 37.0–47.0)
Lab: 104 — ABNORMAL HIGH (ref 80.0–99.0)
Lab: 13
Lab: 133
Lab: 2.9 — ABNORMAL LOW (ref 4.8–10.8)
Lab: 3.4 — ABNORMAL LOW (ref 4.20–5.40)
Lab: 31

## 2018-10-29 LAB — PROTIME INR (PT): Lab: 13 s — ABNORMAL HIGH (ref 9.9–12.6)

## 2018-11-02 ENCOUNTER — Encounter: Admit: 2018-11-02 | Discharge: 2018-11-02

## 2018-11-02 DIAGNOSIS — Z0000 Encounter for general adult medical examination without abnormal findings: ICD-10-CM

## 2018-11-02 DIAGNOSIS — R188 Other ascites: ICD-10-CM

## 2018-11-02 DIAGNOSIS — R7989 Other specified abnormal findings of blood chemistry: ICD-10-CM

## 2018-11-02 DIAGNOSIS — K7460 Unspecified cirrhosis of liver: Principal | ICD-10-CM

## 2018-11-02 LAB — COMPREHENSIVE METABOLIC PANEL
Lab: 10
Lab: 79
Lab: 92
Lab: 0.4 mg/dL
Lab: 0.7 mg/dL
Lab: 10 mg/dL
Lab: 102 U/L
Lab: 103
Lab: 136 mmol/L
Lab: 21 U/L
Lab: 23
Lab: 28 mmol/L
Lab: 3.1 g/dL — ABNORMAL LOW (ref 200–1100)
Lab: 4.7 mmol/L
Lab: 7.5
Lab: 8.1 mg/dL — ABNORMAL LOW (ref 8.4–10.2)

## 2018-12-20 ENCOUNTER — Encounter: Admit: 2018-12-20 | Discharge: 2018-12-20

## 2018-12-29 ENCOUNTER — Encounter: Admit: 2018-12-29 | Discharge: 2018-12-29

## 2018-12-30 ENCOUNTER — Encounter: Admit: 2018-12-30 | Discharge: 2018-12-30

## 2019-01-03 ENCOUNTER — Encounter: Admit: 2019-01-03 | Discharge: 2019-01-03

## 2019-03-29 ENCOUNTER — Encounter: Admit: 2019-03-29 | Discharge: 2019-03-29

## 2019-04-01 ENCOUNTER — Encounter: Admit: 2019-04-01 | Discharge: 2019-04-02

## 2019-04-06 ENCOUNTER — Encounter: Admit: 2019-04-06 | Discharge: 2019-04-06

## 2019-10-06 ENCOUNTER — Encounter: Admit: 2019-10-06 | Discharge: 2019-10-06

## 2020-01-12 MED ORDER — ERGOCALCIFEROL (VITAMIN D2) 1,250 MCG (50,000 UNIT) PO CAP
1 | ORAL_CAPSULE | ORAL | 0 refills | 56.00000 days | Status: AC
Start: 2020-01-12 — End: ?

## 2020-01-27 ENCOUNTER — Encounter: Admit: 2020-01-27 | Discharge: 2020-01-27

## 2020-01-27 NOTE — Telephone Encounter
-----   Message from [place]) [place], [place] sent at 01/26/2020  1:39 PM CDT -----  Regarding: RE: EGD Report  So so funny!      EGD looks good, no EV.  Repeat in 2-3 years. Thanks!!  ----- Message -----  From: Margo Aye, LPN  Sent: 0/63/0160  11:12 AM CDT  To: Johnnye Lana (Cherie) [place], APRN-NP  Subject: EGD Report                                       EGD scanned to outside records, pt declined Colon but told them to hold onto the orders..lol..I tried

## 2020-01-27 NOTE — Telephone Encounter
Spoke with pt, and informed that Katie Rivera reviewed EGD and results are stable. No varices seen, and will look to repeat in 2-3 yrs, sooner with any symptoms. Pt to still consider Colonoscopy and orders are still on file with local GI

## 2020-04-04 ENCOUNTER — Encounter: Admit: 2020-04-04 | Discharge: 2020-04-04

## 2020-04-04 NOTE — Telephone Encounter
Msg left for return call to change Telehealth appt. Will await call back

## 2020-04-16 ENCOUNTER — Encounter: Admit: 2020-04-16 | Discharge: 2020-04-16

## 2020-04-16 DIAGNOSIS — E669 Obesity, unspecified: Secondary | ICD-10-CM

## 2020-04-16 DIAGNOSIS — E43 Unspecified severe protein-calorie malnutrition: Secondary | ICD-10-CM

## 2020-04-16 DIAGNOSIS — M1990 Unspecified osteoarthritis, unspecified site: Secondary | ICD-10-CM

## 2020-04-16 DIAGNOSIS — K760 Fatty (change of) liver, not elsewhere classified: Secondary | ICD-10-CM

## 2020-05-02 ENCOUNTER — Encounter: Admit: 2020-05-02 | Discharge: 2020-05-02

## 2020-07-05 ENCOUNTER — Encounter: Admit: 2020-07-05 | Discharge: 2020-07-05

## 2020-07-05 DIAGNOSIS — E559 Vitamin D deficiency, unspecified: Secondary | ICD-10-CM

## 2020-07-05 DIAGNOSIS — K7460 Unspecified cirrhosis of liver: Secondary | ICD-10-CM

## 2020-07-05 DIAGNOSIS — Z0000 Encounter for general adult medical examination without abnormal findings: Secondary | ICD-10-CM

## 2020-07-05 DIAGNOSIS — K760 Fatty (change of) liver, not elsewhere classified: Secondary | ICD-10-CM

## 2020-07-05 NOTE — Telephone Encounter
Pt has right ankle growth, and needs surgical intervention. Pt was calling to inform on surgery date of 07/19/2020 with Dr. Milana Na Ortho Surgeon [phone]). Records requested for review.

## 2020-07-05 NOTE — Telephone Encounter
Spoke with pt and she agrees to get labs completed this week or next, and orders faxed to [facility]. Pt also states that surgery is now on hold due to abnormal EKG she had completed today. PCP is referring her to [facility] for consult. EKG copy requested for records. Pt to call once labs completed and with Cardiology appt date

## 2020-07-05 NOTE — Telephone Encounter
EKG Scanned to outside records with PCP clinic note. EKG states ventricular trigeminy. Pt has Cardiology appt 07/09/2020.

## 2020-07-06 ENCOUNTER — Encounter: Admit: 2020-07-06 | Discharge: 2020-07-06

## 2020-07-07 ENCOUNTER — Encounter: Admit: 2020-07-07 | Discharge: 2020-07-07

## 2020-07-12 ENCOUNTER — Encounter: Admit: 2020-07-12 | Discharge: 2020-07-12

## 2020-07-12 NOTE — Telephone Encounter
Spoke with Katie Rivera in lab and all pages/orders confirmed. Blank page received was just extra page.

## 2020-07-12 NOTE — Telephone Encounter
Katie Rivera, with [facility], called stating a couple of pages were missing in the faxed lab orders. She is asking that they be resent.

## 2020-07-18 ENCOUNTER — Encounter: Admit: 2020-07-18 | Discharge: 2020-07-18

## 2020-07-18 DIAGNOSIS — K760 Fatty (change of) liver, not elsewhere classified: Secondary | ICD-10-CM

## 2020-07-18 DIAGNOSIS — E43 Unspecified severe protein-calorie malnutrition: Secondary | ICD-10-CM

## 2020-07-18 DIAGNOSIS — E669 Obesity, unspecified: Secondary | ICD-10-CM

## 2020-07-18 DIAGNOSIS — M1990 Unspecified osteoarthritis, unspecified site: Secondary | ICD-10-CM

## 2020-07-24 ENCOUNTER — Encounter: Admit: 2020-07-24 | Discharge: 2020-07-24

## 2020-08-30 ENCOUNTER — Encounter: Admit: 2020-08-30 | Discharge: 2020-08-30

## 2020-08-30 DIAGNOSIS — K760 Fatty (change of) liver, not elsewhere classified: Secondary | ICD-10-CM

## 2020-08-30 DIAGNOSIS — E43 Unspecified severe protein-calorie malnutrition: Secondary | ICD-10-CM

## 2020-08-30 DIAGNOSIS — M1711 Unilateral primary osteoarthritis, right knee: Secondary | ICD-10-CM

## 2020-08-30 DIAGNOSIS — R768 Other specified abnormal immunological findings in serum: Secondary | ICD-10-CM

## 2020-08-30 DIAGNOSIS — E669 Obesity, unspecified: Secondary | ICD-10-CM

## 2020-08-30 DIAGNOSIS — Z9884 Bariatric surgery status: Secondary | ICD-10-CM

## 2020-08-30 DIAGNOSIS — R5381 Other malaise: Secondary | ICD-10-CM

## 2020-08-30 DIAGNOSIS — Z6841 Body Mass Index (BMI) 40.0 and over, adult: Secondary | ICD-10-CM

## 2020-08-30 DIAGNOSIS — M1990 Unspecified osteoarthritis, unspecified site: Secondary | ICD-10-CM

## 2020-08-30 DIAGNOSIS — K7460 Unspecified cirrhosis of liver: Secondary | ICD-10-CM

## 2020-08-30 DIAGNOSIS — N179 Acute kidney failure, unspecified: Secondary | ICD-10-CM

## 2020-08-30 DIAGNOSIS — E441 Mild protein-calorie malnutrition: Secondary | ICD-10-CM

## 2020-08-30 DIAGNOSIS — Z01810 Encounter for preprocedural cardiovascular examination: Secondary | ICD-10-CM

## 2020-08-30 NOTE — Progress Notes
Date of Service: 08/30/2020    Katie Rivera is a 54 y.o. female.       HPI     Katie Rivera is a 54 year old black female.  She does have a history of morbid obesity.  Patient did undergo weight reduction surgery in November 2016.  Patient states that at the time of the surgery she weighed 450 pounds and over time her weight dropped to 143 pounds.  She did gain some weight back, today in our office she weighed 193 pounds.    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    She was evaluated with a twelve-lead EKG on 07/05/2020, this demonstrated normal sinus rhythm, borderline LAD, isolated, monomorphic PVCs.    Patient does not have any symptoms of chest pain, she does not have heart palpitations, no shortness of breath no presyncope or syncope.  Her level of physical activity continues to be decreased mainly due to profound physical deconditioning and right knee pain.  Following the weight reduction surgery she has experienced complications, inability to eat, she is protein deficient and also malnourished.    Patient was evaluated with an echocardiogram on 07/11/2020, the report is available for my review, it demonstrated normal left ventricular systolic function, normal diastolic function, it was no evidence of cardiac chamber enlargement, there are no significant valvular abnormalities.    Social history: Patient is disabled.  She does not smoke cigarettes and does not drink alcohol.    Family history: Patient states that her father did suffer an MI when he was in his 75s.  The mother has a history of atrial fibrillation and she underwent PPM placement.         Vitals:    08/30/20 0912 08/30/20 0925   BP: 138/82 132/88   BP Source: Arm, Left Upper Arm, Right Upper   Patient Position: Sitting Sitting   Pulse: 70    SpO2: 98%    Weight: 87.5 kg (193 lb)    Height: 1.651 m (5' 5)    PainSc: Nine      Body mass index is 32.12 kg/m?Marland Kitchen     Past Medical History  Patient Active Problem List    Diagnosis Date Noted   ? Dyspnea 02/13/2018   ? Malnutrition (HCC) 01/07/2018   ? Hypoglycemia 01/01/2018   ? 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 Hepatic encephalopathy (HCC) 12/31/2017   ? Impaired mobility and ADLs 12/31/2017   ? Debility 12/31/2017   ? Osteoarthritis of right knee 12/31/2017   ? Anemia 12/31/2017   ? Pancytopenia (HCC) 12/20/2017   ? 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 High serum ferritin 12/19/2017   ? Elevated antinuclear antibody (ANA) level 12/19/2017   ? Anasarca 12/19/2017   ? Elevated liver function tests 12/17/2017   ? Severe malnutrition (HCC) 12/17/2017     Class: Chronic   ? NAFLD (nonalcoholic fatty liver disease) 16/06/9603   ? Cirrhosis (HCC) 12/17/2017   ? AKI (acute kidney injury) (HCC) 12/17/2017         Review of Systems   Constitutional: Negative.   HENT: Negative.    Eyes: Negative.    Cardiovascular: Positive for claudication.   Respiratory: Negative.    Endocrine: Positive for cold intolerance.   Hematologic/Lymphatic: Negative.    Skin: Negative.    Musculoskeletal: Positive for arthritis, joint pain, joint swelling and stiffness.   Gastrointestinal: Positive for nausea  and vomiting.   Genitourinary: Negative.    Neurological: Negative.    Psychiatric/Behavioral: Negative.    Allergic/Immunologic: Positive for environmental allergies.       Physical Exam  General Appearance: obese  Skin: warm, moist, no ulcers or xanthomas  Eyes: conjunctivae and lids normal, pupils are equal and round  Lips & Oral Mucosa: no pallor or cyanosis  Neck Veins: neck veins are flat, neck veins are not distended  Chest Inspection: chest is normal in appearance  Respiratory Effort: breathing comfortably, no respiratory distress  Auscultation/Percussion: lungs clear to auscultation, no rales or rhonchi, no wheezing  Cardiac Rhythm: regular rhythm and normal rate  Cardiac Auscultation: S1, S2 normal, no rub, no gallop  Murmurs: no murmur  Carotid Arteries: normal carotid upstroke bilaterally, no bruit  Abdominal aorta: could not be examined due to obese adomen  Lower Extremity Edema: no lower extremity edema  Abdominal Exam: soft, non-tender, no masses, bowel sounds normal  Liver & Spleen: no organomegaly  Language and Memory: patient responsive and seems to comprehend information  Neurologic Exam: neurological assessment grossly intact      Cardiovascular Studies  Twelve-lead EKG demonstrates normal sinus rhythm, isolated monomorphic PVCs, no ST segment T wave changes.    Cardiovascular Health Factors  Vitals BP Readings from Last 3 Encounters:   08/30/20 132/88   01/10/20 126/83   06/29/18 94/64     Wt Readings from Last 3 Encounters:   08/30/20 87.5 kg (193 lb)   01/10/20 78 kg (172 lb)   06/29/18 64.4 kg (142 lb)     BMI Readings from Last 3 Encounters:   08/30/20 32.12 kg/m?   01/10/20 28.62 kg/m?   06/29/18 24.37 kg/m?      Smoking Social History     Tobacco Use   Smoking Status Never Smoker   Smokeless Tobacco Never Used      Lipid Profile Cholesterol   Date Value Ref Range Status   03/11/2018 69 <200 MG/DL Final     HDL   Date Value Ref Range Status   03/11/2018 28 (L) >40 MG/DL Final     LDL   Date Value Ref Range Status   03/11/2018 28 <100 MG/DL Final     Triglycerides   Date Value Ref Range Status   03/11/2018 58 <150 MG/DL Final      Blood Sugar Hemoglobin A1C   Date Value Ref Range Status   12/16/2017 4.1 4.0 - 6.0 % Final     Comment:     The ADA recommends that most patients with type 1 and type 2 diabetes maintain   an A1c level <7%.       Glucose   Date Value Ref Range Status   03/27/2020 83  Final   01/10/2020 77 70 - 100 MG/DL Final   16/06/9603 79  Final   07/12/2018 70  Final   06/29/2018 73 70 - 100 MG/DL Final   54/05/8118 77  Final     Glucose, POC   Date Value Ref Range Status   01/09/2018 59 (L) 70 - 100 MG/DL Final   14/78/2956 56 (L) 70 - 100 MG/DL Final   21/30/8657 58 (L) 70 - 100 MG/DL Final Problems Addressed Today  Encounter Diagnoses   Name Primary?   ? AKI (acute kidney injury) (HCC) Yes   ? Body mass index (BMI) 70 or greater, adult (HCC)    ? Cirrhosis of liver with ascites, unspecified hepatic cirrhosis type (HCC)    ?  Debility    ? Elevated antinuclear antibody (ANA) level    ? Severe malnutrition (HCC)    ? History of Roux-en-Y gastric bypass    ? Mild protein-calorie malnutrition (HCC)    ? NAFLD (nonalcoholic fatty liver disease)    ? Osteoarthritis of right knee, unspecified osteoarthritis type    ? Preop cardiovascular exam        Assessment and Plan     In summary: This is a 54 year old black female, she presents with the following cardiovascular issues:    1.  Preoperative cardiac evaluation preceding right ankle surgery  2.  History of morbid obesity?patient did undergo weight reduction surgery in November 2016  ? Patient reports suffering complications, she does have a history of not being able to have an adequate nutritional intake  3.  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 Patient does not have any symptoms of presyncope or syncope, no dizziness, no lightheadedness, no documented ventricular arrhythmia  ? Patient's medication list does include furosemide, however she does not take these medications on a scheduled basis  ? The most recent chemistry profile dated 03/27/2020 demonstrated a normal potassium level of 4.28 mEq/L  4.  Recent 2D echo Doppler study dated 07/09/2020?the report was reviewed by me as well, the study is unremarkable, I demonstrate normal systolic function and unremarkable valvular structures    Plan:    1.  From a cardiac standpoint this patient is stable to undergo general anesthesia and the planned orthopedic intervention, from a cardiac standpoint this patient is overall low risk.  2.  I recommend to monitor the potassium and avoid hypokalemia since this could further cause PVCs and ventricular arrhythmias, I recommend a potassium to be above 4.0 mEq/L  3.  Continue risk factors modification    Total Time Today was 45 minutes in the following activities: Preparing to see the patient, Obtaining and/or reviewing separately obtained history, Performing a medically appropriate examination and/or evaluation, Counseling and educating the patient/family/caregiver, Referring and communication with other health care professionals (when not separately reported), Documenting clinical information in the electronic or other health record, Independently interpreting results (not separately reported) and communicating results to the patient/family/caregiver and Care coordination (not separately reported)           Current Medications (including today's revisions)  ? ascorbic acid (VITAMIN C PO) Take 1 tablet by mouth daily.   ? calcium citrate/vitamin D3 (CALCIUM CITRATE + D PO) Take 1 tablet by mouth daily.   ? cholecalciferol (VITAMIN D-3) 5000 unit tablet Take 5,000 Units by mouth daily.   ? cyanocobalamin (VITAMIN B-12, RUBRAMIN) 1,000 mcg/mL injection Inject 1 mL into the muscle every 30 days.   ? ergocalciferol (VITAMIN D) 1,250 mcg (50,000 unit) capsule Take one capsule by mouth three times weekly. for 12 weeks, then stop.  Indications: vitamin D deficiency (high dose therapy)   ? esomeprazole DR(+) (NEXIUM) 40 mg capsule Take 40 mg by mouth daily as needed. Take on an empty stomach at least 1 hour before or 2 hours after food.   ? ferrous sulfate (FEOSOL) 325 mg (65 mg iron) tablet Take 325 mg by mouth daily. Take on an empty stomach at least 1 hour before or 2 hours after food.    ? fluticasone propionate (FLONASE) 50 mcg/actuation nasal spray, suspension Apply 2 sprays to each nostril as directed daily. Shake bottle gently before using.   ? furosemide (LASIX) 40 mg tablet Take one tablet by mouth  twice weekly. (Patient taking differently: Take 40 mg by mouth as Needed. MEDICATION ON HOLD)   ? loperamide (IMODIUM A-D) 2 mg capsule Take 2 capsules by mouth initially, followed by 1 capsule by mouth after each loose stool up to a maximum of 8 tablets in 24 hours.   ? magnesium oxide (MAG-OX) 400 mg (241.3 mg magnesium) tablet Take 250 mg by mouth daily.   ? omeprazole DR (PRILOSEC) 20 mg capsule Take 20 mg by mouth twice daily.   ? pancrelipase (CREON 24,000) 24,000-76,000 -120,000 units capsule Take two capsules by mouth every 4 hours. While on tube feeds   ? POTASSIUM-99 PO Take 1 capsule by mouth daily.   ? promethazine (PHENERGAN) 25 mg tablet Take 25 mg by mouth every 6 hours as needed for Nausea or Vomiting.   ? thiamine (VITAMIN B-1) 50 mg tablet Take 50 mg by mouth daily.   ? thiamine mononitrate 100 mg tablet Take one tablet by mouth daily.   ? vitamin A 10,000 unit capsule Take 10,000 Units by mouth daily.   ? VITAMIN K2 PO Take 1 tablet by mouth daily.   ? vitamins, multi w/ A,D,E,K (AQUADEKS) 100-350-5 mcg-mcg-mg chew tablet Chew one tablet by mouth daily.   ? zinc sulfate 220 mg (50 mg elemental zinc) capsule Take one capsule by mouth daily. Begin after completing copper supplementation

## 2020-09-06 ENCOUNTER — Encounter: Admit: 2020-09-06 | Discharge: 2020-09-06

## 2020-09-06 NOTE — Telephone Encounter
Spoke with pt, and labs not completed as of yet. Only lab completed was Vit D in Sept 2021. Pt has upcoming appt with PCP and lab orders faxed to Dr. Lorrin Mais office. Pt to call once labs completed

## 2022-02-04 ENCOUNTER — Encounter: Admit: 2022-02-04 | Discharge: 2022-02-04

## 2022-02-04 NOTE — Telephone Encounter
Pt calling to sch appt w/ Dr Marcelo Baldy due to enzyme levels., notified schlr  Deanna Artis to cb

## 2022-02-04 NOTE — Telephone Encounter
Spoke to patient scheduled appt 5/30@230  with Marcelo Baldy

## 2022-02-06 ENCOUNTER — Encounter: Admit: 2022-02-06 | Discharge: 2022-02-06

## 2022-02-11 ENCOUNTER — Ambulatory Visit: Admit: 2022-02-11 | Discharge: 2022-02-11

## 2022-02-11 ENCOUNTER — Encounter: Admit: 2022-02-11 | Discharge: 2022-02-11

## 2022-02-11 ENCOUNTER — Ambulatory Visit: Admit: 2022-02-11 | Discharge: 2022-02-11 | Payer: Medicaid Other

## 2022-02-11 DIAGNOSIS — D509 Iron deficiency anemia, unspecified: Secondary | ICD-10-CM

## 2022-02-11 DIAGNOSIS — K7460 Unspecified cirrhosis of liver: Secondary | ICD-10-CM

## 2022-02-11 DIAGNOSIS — E8809 Other disorders of plasma-protein metabolism, not elsewhere classified: Secondary | ICD-10-CM

## 2022-02-11 DIAGNOSIS — M1990 Unspecified osteoarthritis, unspecified site: Secondary | ICD-10-CM

## 2022-02-11 DIAGNOSIS — K760 Fatty (change of) liver, not elsewhere classified: Secondary | ICD-10-CM

## 2022-02-11 DIAGNOSIS — K7682 Hepatic encephalopathy syndrome (HCC): Secondary | ICD-10-CM

## 2022-02-11 DIAGNOSIS — E669 Obesity, unspecified: Secondary | ICD-10-CM

## 2022-02-11 DIAGNOSIS — Z9889 Other specified postprocedural states: Secondary | ICD-10-CM

## 2022-02-11 DIAGNOSIS — E509 Vitamin A deficiency, unspecified: Secondary | ICD-10-CM

## 2022-02-11 DIAGNOSIS — H409 Unspecified glaucoma: Secondary | ICD-10-CM

## 2022-02-11 DIAGNOSIS — K909 Intestinal malabsorption, unspecified: Secondary | ICD-10-CM

## 2022-02-11 DIAGNOSIS — K7210 Chronic hepatic failure without coma: Secondary | ICD-10-CM

## 2022-02-11 DIAGNOSIS — M810 Age-related osteoporosis without current pathological fracture: Secondary | ICD-10-CM

## 2022-02-11 DIAGNOSIS — E43 Unspecified severe protein-calorie malnutrition: Secondary | ICD-10-CM

## 2022-02-11 LAB — PROTIME INR (PT)
INR: 1.2 mg/dL — ABNORMAL LOW (ref 0.8–1.2)
PROTIME: 13 s — ABNORMAL HIGH (ref 9.5–14.2)

## 2022-02-11 LAB — CBC AND DIFF
ABSOLUTE BASO COUNT: 0 K/UL (ref 0–0.20)
ABSOLUTE EOS COUNT: 0 K/UL (ref 0–0.45)
ABSOLUTE LYMPH COUNT: 2 K/UL (ref 1.0–4.8)
ABSOLUTE MONO COUNT: 0.2 K/UL (ref 0–0.80)
ABSOLUTE NEUTROPHIL: 1 K/UL — ABNORMAL LOW (ref 1.8–7.0)
BASOPHILS %: 1 % (ref 0–2)
EOSINOPHILS %: 2 % (ref 0–5)
HEMATOCRIT: 29 % — ABNORMAL LOW (ref 36–45)
HEMOGLOBIN: 8.9 g/dL — ABNORMAL LOW (ref 12.0–15.0)
LYMPHOCYTES %: 60 % — ABNORMAL HIGH (ref 24–44)
MCH: 28 pg (ref 26–34)
MCHC: 30 g/dL — ABNORMAL LOW (ref 32.0–36.0)
MCV: 96 FL — ABNORMAL LOW (ref 80–100)
MONOCYTES %: 8 % (ref 4–12)
MPV: 10 FL (ref 7–11)
NEUTROPHILS %: 29 % — ABNORMAL LOW (ref 41–77)
PLATELET COUNT: 141 K/UL — ABNORMAL LOW (ref 150–400)
RBC COUNT: 3 M/UL — ABNORMAL LOW (ref 4.0–5.0)
RDW: 14 % — ABNORMAL LOW (ref >60)
WBC COUNT: 3.5 K/UL — ABNORMAL LOW (ref 4.5–11.0)

## 2022-02-11 LAB — COMPREHENSIVE METABOLIC PANEL
BLD UREA NITROGEN: 19 mg/dL (ref 7–25)
GLUCOSE,PANEL: 70 mg/dL (ref 70–100)
POTASSIUM: 6.2 MMOL/L — ABNORMAL HIGH (ref 3.5–5.1)
SODIUM: 139 MMOL/L (ref 137–147)
TOTAL PROTEIN: 7.4 g/dL (ref 6.0–8.0)

## 2022-02-11 LAB — ALPHA FETO PROTEIN (AFP)

## 2022-02-19 ENCOUNTER — Encounter: Admit: 2022-02-19 | Discharge: 2022-02-19

## 2022-02-19 DIAGNOSIS — K7460 Unspecified cirrhosis of liver: Secondary | ICD-10-CM

## 2022-02-19 NOTE — Telephone Encounter
Called patient, reviewed labs and recommendations.  1.  [place]- requests to be done at [facility]  2.  EGD- requests to be done at [facility]  3.  Vitamin D- outside provider has ordered high dose therapy- medication list updated.  Recommended she continue to follow up with that providers for therapy.  4.  K+- she is holding her potassium and will have labs at [facility] in 1-2 weeks.  She is not on any diuretics.

## 2022-02-19 NOTE — Telephone Encounter
-----  Message from [place], [place] sent at 02/14/2022  4:09 PM CDT -----  MELD 12 with labs c/w trends and known history.  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  Would recommend she complete liver dedicated [employer] screening with an [place] at minimum (CT recently completed and not sufficient study).    Hgb is down, she is overdue for an EGD and recommend this be done now.  She has had no reports of overt bleeding.    Please supplement vitamin D per protocol.    Potassium is elevated with her creatinine trending up from previous values.  Recommend she stop her potassium supplement and double check she is not on any diuretics.     Repeat BMP 1-2 weeks after stopping potassium. thanks

## 2022-02-25 ENCOUNTER — Encounter: Admit: 2022-02-25 | Discharge: 2022-02-25

## 2022-03-28 ENCOUNTER — Encounter: Admit: 2022-03-28 | Discharge: 2022-03-28

## 2022-03-28 DIAGNOSIS — K7460 Unspecified cirrhosis of liver: Secondary | ICD-10-CM

## 2022-03-28 DIAGNOSIS — Z0000 Encounter for general adult medical examination without abnormal findings: Secondary | ICD-10-CM

## 2022-03-28 DIAGNOSIS — R7989 Other specified abnormal findings of blood chemistry: Secondary | ICD-10-CM

## 2022-03-28 DIAGNOSIS — K760 Fatty (change of) liver, not elsewhere classified: Secondary | ICD-10-CM

## 2022-03-28 NOTE — Telephone Encounter
Spoke with pt and informed of stable Ultrasound results, with no concerning liver lesions noted. Will repeat US in 6 months with clinic appt 10/01/2022 at 9:15am, and pt confirmed. Will send appt reminder.

## 2022-04-05 ENCOUNTER — Encounter: Admit: 2022-04-05 | Discharge: 2022-04-05

## 2022-04-05 ENCOUNTER — Inpatient Hospital Stay: Admit: 2022-04-05 | Discharge: 2022-04-05

## 2022-04-05 VITALS — BP 106/80 | HR 63 | Temp 97.30000°F | Ht 65.0 in | Wt 172.0 lb

## 2022-04-05 VITALS — BP 110/60 | HR 70

## 2022-04-05 VITALS — Wt 172.0 lb

## 2022-04-05 VITALS — BP 110/60 | HR 62 | Temp 98.00000°F

## 2022-04-05 VITALS — HR 71

## 2022-04-05 VITALS — BP 117/73 | HR 59

## 2022-04-05 DIAGNOSIS — K56609 Unspecified intestinal obstruction, unspecified as to partial versus complete obstruction: Principal | ICD-10-CM

## 2022-04-05 DIAGNOSIS — R109 Unspecified abdominal pain: Secondary | ICD-10-CM

## 2022-04-05 LAB — PTT (APTT): PTT: 31 s — ABNORMAL LOW (ref 24.0–36.5)

## 2022-04-05 LAB — COMPREHENSIVE METABOLIC PANEL
ALBUMIN: 3.2 g/dL — ABNORMAL LOW (ref 3.5–5.0)
ALK PHOSPHATASE: 220 U/L — ABNORMAL HIGH (ref 25–110)
ALT: 7 U/L (ref 7–56)
ANION GAP: 9 K/UL (ref 3–12)
AST: 14 U/L (ref 7–40)
BLD UREA NITROGEN: 18 mg/dL — ABNORMAL LOW (ref 7–25)
CALCIUM: 7 mg/dL — ABNORMAL LOW (ref 8.5–10.6)
CO2: 14 MMOL/L — ABNORMAL LOW (ref 21–30)
CREATININE: 1 mg/dL — ABNORMAL HIGH (ref 0.4–1.00)
EGFR: >60 mL/min (ref >60)
SODIUM: 138 MMOL/L — ABNORMAL LOW (ref 137–147)
TOTAL BILIRUBIN: 0.3 mg/dL (ref 0.3–1.2)
TOTAL PROTEIN: 6 g/dL (ref 6.0–8.0)

## 2022-04-05 LAB — CBC AND DIFF
ABSOLUTE BASO COUNT: 0 K/UL (ref 0–0.20)
ABSOLUTE EOS COUNT: 0 K/UL (ref 0–0.45)
WBC COUNT: 3.8 K/UL — ABNORMAL LOW (ref 4.5–11.0)

## 2022-04-05 LAB — MAGNESIUM: MAGNESIUM: 1.4 mg/dL — ABNORMAL LOW (ref 1.6–2.6)

## 2022-04-05 LAB — LACTIC ACID(LACTATE): LACTIC ACID: 0.7 MMOL/L (ref 0.5–2.0)

## 2022-04-05 LAB — PHOSPHORUS: PHOSPHORUS: 4.3 mg/dL — ABNORMAL HIGH (ref 2.0–4.5)

## 2022-04-05 LAB — PROTIME INR (PT): PROTIME: 13 s — ABNORMAL LOW (ref 9.5–14.2)

## 2022-04-05 MED ORDER — HYDROMORPHONE (PF) 2 MG/ML IJ SYRG
.5-1 mg | Freq: Once | INTRAVENOUS | 0 refills | Status: CP
Start: 2022-04-05 — End: ?
  Administered 2022-04-06: 05:00:00 0.5 mg via INTRAVENOUS

## 2022-04-05 MED ORDER — ONDANSETRON HCL (PF) 4 MG/2 ML IJ SOLN
4 mg | INTRAVENOUS | 0 refills | Status: DC | PRN
Start: 2022-04-05 — End: 2022-04-07
  Administered 2022-04-06 – 2022-04-07 (×4): 4 mg via INTRAVENOUS

## 2022-04-05 MED ORDER — FENTANYL CITRATE (PF) 50 MCG/ML IJ SOLN
INTRAVENOUS | 0 refills | Status: AC
Start: 2022-04-05 — End: ?
  Administered 2022-04-06: 05:00:00 100 ug via INTRAVENOUS

## 2022-04-05 MED ORDER — LIDOCAINE (PF) 200 MG/10 ML (2 %) IJ SYRG
INTRAVENOUS | 0 refills | Status: AC
Start: 2022-04-05 — End: ?
  Administered 2022-04-06: 05:00:00 80 mg via INTRAVENOUS

## 2022-04-05 MED ORDER — ENOXAPARIN 40 MG/0.4 ML SC SYRG
40 mg | Freq: Every day | SUBCUTANEOUS | 0 refills | Status: DC
Start: 2022-04-05 — End: 2022-04-07

## 2022-04-05 MED ORDER — LACTATED RINGERS IV SOLP
INTRAVENOUS | 0 refills | Status: AC
Start: 2022-04-05 — End: ?
  Administered 2022-04-06: 05:00:00 via INTRAVENOUS

## 2022-04-05 MED ORDER — ROCURONIUM 10 MG/ML IV SOLN
INTRAVENOUS | 0 refills | Status: AC
Start: 2022-04-05 — End: ?
  Administered 2022-04-06: 05:00:00 85 mg via INTRAVENOUS

## 2022-04-05 MED ORDER — PROPOFOL INJ 10 MG/ML IV VIAL
INTRAVENOUS | 0 refills | Status: AC
Start: 2022-04-05 — End: ?
  Administered 2022-04-06: 05:00:00 40 mg via INTRAVENOUS
  Administered 2022-04-06: 05:00:00 100 mg via INTRAVENOUS

## 2022-04-05 MED ORDER — PANTOPRAZOLE 40 MG IV SOLR
40 mg | Freq: Every day | INTRAVENOUS | 0 refills | Status: DC
Start: 2022-04-05 — End: 2022-04-07
  Administered 2022-04-06 – 2022-04-07 (×2): 40 mg via INTRAVENOUS

## 2022-04-06 VITALS — BP 98/62 | HR 57

## 2022-04-06 VITALS — Temp 98.00000°F

## 2022-04-06 VITALS — HR 68

## 2022-04-06 VITALS — BP 115/73 | HR 51 | Temp 97.60000°F

## 2022-04-06 VITALS — BP 109/60 | HR 48

## 2022-04-06 VITALS — HR 59

## 2022-04-06 VITALS — BP 99/60 | HR 63

## 2022-04-06 VITALS — BP 105/62 | HR 48

## 2022-04-06 VITALS — BP 110/76 | HR 65 | Temp 97.40000°F

## 2022-04-06 VITALS — BP 101/56 | HR 58

## 2022-04-06 VITALS — BP 104/62 | HR 50

## 2022-04-06 VITALS — BP 103/72 | HR 50 | Temp 97.60000°F

## 2022-04-06 VITALS — BP 111/62 | HR 60 | Temp 98.00000°F

## 2022-04-06 VITALS — BP 102/58 | HR 46 | Temp 97.60000°F

## 2022-04-06 VITALS — BP 96/60 | HR 52

## 2022-04-06 VITALS — BP 119/65 | HR 47 | Temp 98.50000°F

## 2022-04-06 VITALS — BP 106/72 | HR 51

## 2022-04-06 VITALS — BP 115/72 | HR 61

## 2022-04-06 LAB — COMPREHENSIVE METABOLIC PANEL
ALBUMIN: 2.6 g/dL — ABNORMAL LOW (ref 3.5–5.0)
ALK PHOSPHATASE: 173 U/L — ABNORMAL HIGH (ref 25–110)
ALT: 5 U/L — ABNORMAL LOW (ref 7–56)
ANION GAP: 7 (ref 3–12)
AST: 19 U/L (ref 7–40)
CO2: 15 MMOL/L — ABNORMAL LOW (ref 21–30)
EGFR: >60 mL/min (ref >60)
SODIUM: 141 MMOL/L — ABNORMAL LOW (ref 137–147)
TOTAL BILIRUBIN: 0.3 mg/dL (ref 0.3–1.2)

## 2022-04-06 LAB — TYPE & CROSSMATCH
ANTIBODY SCREEN: NEGATIVE
UNITS ORDERED: 0

## 2022-04-06 LAB — CBC CELLULAR THERAPEUTICS
HEMATOCRIT: 24 % — ABNORMAL LOW (ref 36–45)
MCH: 28 pg — ABNORMAL HIGH (ref 26–34)
MCHC: 30 g/dL — ABNORMAL LOW (ref 32.0–36.0)
MCV: 92 FL — ABNORMAL HIGH (ref 80–100)
MPV: 11 FL — ABNORMAL HIGH (ref 7–11)
PLATELET COUNT: 138 10*3/uL — ABNORMAL LOW (ref 150–400)
RBC COUNT: 2.6 M/UL — ABNORMAL LOW (ref 4.0–5.0)
RDW: 15 % — ABNORMAL HIGH (ref 11–15)
WBC COUNT: 4.4 10*3/uL — ABNORMAL LOW (ref 4.5–11.0)

## 2022-04-06 LAB — BLOOD BANK SAMPLE HOLD

## 2022-04-06 MED ORDER — PATCH DOCUMENTATION - SCOPOLAMINE BASE 1 MG/72HR
1 | Freq: Two times a day (BID) | TRANSDERMAL | 0 refills | Status: DC
Start: 2022-04-06 — End: 2022-04-07

## 2022-04-06 MED ORDER — HYDROMORPHONE (PF) 2 MG/ML IJ SYRG
1 mg | INTRAVENOUS | 0 refills | Status: DC | PRN
Start: 2022-04-06 — End: 2022-04-06

## 2022-04-06 MED ORDER — FENTANYL CITRATE (PF) 50 MCG/ML IJ SOLN
25 ug | INTRAVENOUS | 0 refills | Status: DC | PRN
Start: 2022-04-06 — End: 2022-04-06

## 2022-04-06 MED ORDER — SENNOSIDES 8.6 MG PO TAB
1 | Freq: Two times a day (BID) | ORAL | 0 refills | Status: DC
Start: 2022-04-06 — End: 2022-04-06

## 2022-04-06 MED ORDER — SCOPOLAMINE BASE 1 MG OVER 3 DAYS TD PT3D
1 | TRANSDERMAL | 0 refills | Status: DC
Start: 2022-04-06 — End: 2022-04-07
  Administered 2022-04-06: 06:00:00 1 via TRANSDERMAL

## 2022-04-06 MED ORDER — PROMETHAZINE 25 MG RE SUPP
25 mg | RECTAL | 0 refills | Status: DC | PRN
Start: 2022-04-06 — End: 2022-04-06

## 2022-04-06 MED ORDER — HYDROMORPHONE (PF) 2 MG/ML IJ SYRG
.5 mg | INTRAVENOUS | 0 refills | Status: DC | PRN
Start: 2022-04-06 — End: 2022-04-07
  Administered 2022-04-06 (×3): 0.5 mg via INTRAVENOUS

## 2022-04-06 MED ORDER — BUPIVACAINE-EPINEPHRINE 0.5 %-1:200,000 IJ SOLN
0 refills | Status: DC
Start: 2022-04-06 — End: 2022-04-06
  Administered 2022-04-06: 06:00:00 5 mL via INTRAMUSCULAR

## 2022-04-06 MED ORDER — POLYETHYLENE GLYCOL 3350 17 GRAM PO PWPK
1 | Freq: Every day | ORAL | 0 refills | Status: DC | PRN
Start: 2022-04-06 — End: 2022-04-07

## 2022-04-06 MED ORDER — HYDROMORPHONE (PF) 2 MG/ML IJ SYRG
.5 mg | INTRAVENOUS | 0 refills | Status: DC | PRN
Start: 2022-04-06 — End: 2022-04-06

## 2022-04-06 MED ORDER — ONDANSETRON HCL (PF) 4 MG/2 ML IJ SOLN
4 mg | Freq: Once | INTRAVENOUS | 0 refills | Status: AC | PRN
Start: 2022-04-06 — End: ?

## 2022-04-06 MED ORDER — CEFOXITIN 2 GRAM IV SOLR
INTRAVENOUS | 0 refills | Status: AC
Start: 2022-04-06 — End: ?
  Administered 2022-04-06: 05:00:00 2 g via INTRAVENOUS

## 2022-04-06 MED ORDER — PROCHLORPERAZINE EDISYLATE 5 MG/ML IJ SOLN
5 mg | INTRAVENOUS | 0 refills | Status: DC | PRN
Start: 2022-04-06 — End: 2022-04-07
  Administered 2022-04-06: 14:00:00 5 mg via INTRAVENOUS

## 2022-04-06 MED ORDER — OXYCODONE 5 MG PO TAB
5-10 mg | ORAL | 0 refills | Status: DC | PRN
Start: 2022-04-06 — End: 2022-04-06
  Administered 2022-04-06 (×2): 10 mg via ORAL

## 2022-04-06 MED ORDER — OXYCODONE 5 MG PO TAB
5 mg | Freq: Once | ORAL | 0 refills | Status: DC | PRN
Start: 2022-04-06 — End: 2022-04-06

## 2022-04-06 MED ORDER — OXYCODONE 5 MG PO TAB
5 mg | ORAL | 0 refills | Status: DC | PRN
Start: 2022-04-06 — End: 2022-04-07
  Administered 2022-04-06 – 2022-04-07 (×3): 5 mg via ORAL

## 2022-04-06 MED ORDER — POLYETHYLENE GLYCOL 3350 17 GRAM PO PWPK
1 | Freq: Every day | ORAL | 0 refills | Status: DC
Start: 2022-04-06 — End: 2022-04-06

## 2022-04-06 MED ORDER — ACETAMINOPHEN 325 MG PO TAB
650 mg | ORAL | 0 refills | Status: DC | PRN
Start: 2022-04-06 — End: 2022-04-07
  Administered 2022-04-06 – 2022-04-07 (×4): 650 mg via ORAL

## 2022-04-06 MED ORDER — PROMETHAZINE 25 MG PO TAB
25 mg | ORAL | 0 refills | Status: DC | PRN
Start: 2022-04-06 — End: 2022-04-06

## 2022-04-06 MED ORDER — PROMETHAZINE 25 MG PO TAB
25 mg | ORAL | 0 refills | Status: DC | PRN
Start: 2022-04-06 — End: 2022-04-07
  Administered 2022-04-06 – 2022-04-07 (×2): 25 mg via ORAL

## 2022-04-06 MED ORDER — ACETAMINOPHEN 500 MG PO TAB
1000 mg | Freq: Once | ORAL | 0 refills | Status: DC | PRN
Start: 2022-04-06 — End: 2022-04-06

## 2022-04-06 MED ORDER — LIPASE-PROTEASE-AMYLASE (CREON 24,000) 24,000-76,000- 120,000 UNIT PO CPDR
2 | Freq: Every day | ORAL | 0 refills | Status: DC
Start: 2022-04-06 — End: 2022-04-07
  Administered 2022-04-07: 13:00:00 2 via ORAL

## 2022-04-06 MED ORDER — MAGNESIUM SULFATE IN WATER 4 GRAM/50 ML (8 %) IV PGBK
4 g | Freq: Once | INTRAVENOUS | 0 refills | Status: CP
Start: 2022-04-06 — End: ?
  Administered 2022-04-06: 08:00:00 4 g via INTRAVENOUS

## 2022-04-06 MED ORDER — HALOPERIDOL LACTATE 5 MG/ML IJ SOLN
1 mg | Freq: Once | INTRAVENOUS | 0 refills | Status: DC | PRN
Start: 2022-04-06 — End: 2022-04-06

## 2022-04-06 MED ADMIN — SUGAMMADEX 100 MG/ML IV SOLN [328079]: 200 mg | INTRAVENOUS | @ 06:00:00 | Stop: 2022-04-06 | NDC 00006542312

## 2022-04-06 MED ADMIN — PHENYLEPHRINE HCL IN 0.9% NACL 1 MG/10 ML (100 MCG/ML) IV SYRG [303060]: 100 ug | INTRAVENOUS | @ 06:00:00 | Stop: 2022-04-06 | NDC 69374095710

## 2022-04-06 MED ADMIN — DEXAMETHASONE SODIUM PHOSPHATE 4 MG/ML IJ SOLN [2332]: 4 mg | INTRAVENOUS | @ 05:00:00 | Stop: 2022-04-06 | NDC 63323016501

## 2022-04-06 MED ADMIN — PHENYLEPHRINE HCL IN 0.9% NACL 1 MG/10 ML (100 MCG/ML) IV SYRG [303060]: 50 ug | INTRAVENOUS | @ 06:00:00 | Stop: 2022-04-06 | NDC 69374095710

## 2022-04-06 MED ADMIN — SUGAMMADEX 100 MG/ML IV SOLN [328079]: 100 mg | INTRAVENOUS | @ 06:00:00 | Stop: 2022-04-06 | NDC 00006542312

## 2022-04-06 MED ADMIN — ARTIFICIAL TEARS (PF) SINGLE DOSE DROPS GROUP [280009]: 2 [drp] | OPHTHALMIC | @ 05:00:00 | Stop: 2022-04-06 | NDC 00065806301

## 2022-04-06 MED ADMIN — ONDANSETRON HCL (PF) 4 MG/2 ML IJ SOLN [136012]: 4 mg | INTRAVENOUS | @ 06:00:00 | Stop: 2022-04-06 | NDC 25021077702

## 2022-04-06 NOTE — Progress Notes
~  2200: pt arrive on SICU no acute changes occurred. See ICU doc flow sheet for assessment and details.  2350: Pt transported to OR, no acute changes.  0120: Pt transported from OR to SICU. No acute changes see ICU doc flow sheets for detailed assessment.

## 2022-04-06 NOTE — Other
Brief Operative Note    Name: Katie Rivera is a 56 y.o. female     DOB: 05-Dec-1965             MRN#: [id number]  DATE OF OPERATION: 04/05/2022 - 04/06/2022    Date:  04/06/2022        Pre Operative Dx:  Small bowel obstruction (HCC) [K56.609]    Post Operative Dx:  Post-op Diagnosis      * Small bowel obstruction (HCC) [K56.609]       Procedure:  1. Diagnostic laparoscopy    Anesthesia Type:  General    Surgeon(s) and Role:     * Hunter, Rosalio Loud, MD - Primary     * Satira Sark, MD - Resident - Assisting     [facility], Vivia Ewing, MD - Resident - Assisting      Findings:  Negative diagnostic laparoscopy. Two large mesenteric defects with bowel that slides easily through the defects and was decompressed on both sides. No sign of bowel ischemia, necrosis, or internal hernia.    Estimated Blood Loss: 0 mL    Specimen(s) Removed/Disposition: * No specimens in log *    Complications: None    Implants: None    Drains: None    Disposition:  None    Satira Sark, MD  Pager [phone]

## 2022-04-06 NOTE — Progress Notes
RT Adult Assessment Note    NAME:Fred NATALIYA GRAIG             MRN: [id number]             DOB:23-Nov-1965          AGE: 56 y.o.  ADMISSION DATE: 04/05/2022             DAYS ADMITTED: LOS: 0 days    RT Treatment Plan:       Protocol Plan: Procedures  PEP Therapy: Place a nursing order for "IS Q1h While Awake" for any of Lung Expansion indicators  Oxygen/Humidity: O2 to keep SpO2 > 92%, if on room air for > 24 hours and no other RT modalities are required, then D/C protocol    Additional Comments:  Impressions of the patient: NAD on room air. A/O x 4.  Intervention(s)/outcome(s): RT evaluation. Surgery scheduled 7/22-23   Patient education that was completed: None.  Recommendations to the care team: Encourage IS as able.    Vital Signs:  Pulse: 71  RR: 13 PER MINUTE  SpO2: 96 %  O2 Device: None (Room air)  Liter Flow:    O2%:    Breath Sounds: Clear (Implies normal)  Respiratory Effort: Unlabored

## 2022-04-06 NOTE — Progress Notes
OCCUPATIONAL THERAPY  DISCONTINUE NOTE    Patient denies changes from baseline ADL?s and functional mobility and reports no history of balance loss or falls in the last three months.  Patient s/p laparoscopic exploration however is mobilizing in room and out of bed independently. Patient has not demonstrated or reported new difficulties with vision when completing functional tasks.  Patient endorses no concerns with functional skills and mobility at home with continued use of single point cane.    Encouraged patient to continue to perform functional skills/ADL?s while in the hospital and discussed the ability for the patient to complete their ADL?s and mobilization with the bedside nursing staff.  Occupational and physical therapy services will be discontinued at this time, please re-consult if the patient has a change in functional status.      Dineen Kid, OTR/L [phone]

## 2022-04-06 NOTE — H&P (View-Only)
Admission History and Physical Examination      Name: Katie Rivera   MRN: [id number]     DOB: [birth year]      Age: 56 y.o.  Admission Date: 04/05/2022     LOS: 0 days     Date of Service: 04/05/2022                        Assessment/Plan:  56 yo female with pmh of duodenal switch who presents with 2 weeks of abdominal pain and imaging concerning for internal hernia.   Principal Problem:    Small bowel obstruction [facility])    Plan:   OR for diagnostic laparoscopy possible laparotomy    Patient's mother: [phone]    Patient discussed with attending, Dr. Durene Cal.    Connye Burkitt DO  PGY-3 General Surgery    _____________________________________________________________________________    Primary Care Physician: Rockwell Germany    Chief Complaint:  Abdominal pain  History of Present Illness: Katie Rivera is a 56 y.o. female transferred from [facility] due to abdominal pain with concern for internal hernia. Patient states she has been experiencing constant pain in her RLQ and epigastric region. States she has been unable to eat more than one meal per day due to nausea and vomiting. She has been experiencing similar pain on and off for years. She is having normal bowel movements. She was schedule for outpatient EGD on Monday.     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 Arthritis     Right knee   ? Chronic liver failure (HCC)    ? Cirrhosis (HCC)    ? Glaucoma    ? Hepatic encephalopathy syndrome (HCC)    ? Hepatic steatosis    ? Hepatic steatosis    ? History of abdominal paracentesis    ? Hypoalbuminemia    ? Iron deficiency anemia    ? Malabsorption syndrome    ? Nonalcoholic fatty liver disease    ? Obesity    ? Osteoporosis    ? Severe malnutrition (HCC)    ? Vitamin A deficiency      Surgical History:   Procedure Laterality Date   ? HX CHOLECYSTECTOMY  2009   ? STOMACH SURGERY 07/2015    Lap biliopancreatic diversion and duodenal switch   ? GASTRIC BYPASS     ? TONSILLECTOMY       Family history reviewed; non-contributory  Social History     Tobacco Use   ? Smoking status: Never   ? Smokeless tobacco: Never   Substance and Sexual Activity   ? Alcohol use: Never   ? Drug use: Never             Immunizations (includes history and patient reported):   Immunization History   Administered Date(s) Administered   ? COVID-19 (MODERNA BOOSTER), mRNA vacc, 50 mcg/0.25 mL (PF) 12/02/2019, 01/03/2020, 07/16/2020   ? COVID-19 (MODERNA), mRNA vacc, 100 mcg/0.5 mL (PF) 12/02/2019, 01/03/2020, 12/17/2020   ? FLU VACCINE >3YO 05/15/2019   ? Flu Vaccine =>6 Months Quadrivalent PF 10/22/2018           Allergies:  Tetanus and diphtheria toxoids, adsorbed, adult    Medications:  Medications Prior to Admission   Medication Sig   ? ascorbic acid (VITAMIN C PO) Take  by mouth.   ? calcium citrate/vitamin D3 (CALCIUM  CITRATE + D PO) Take 1 tablet by mouth daily.   ? cholecalciferol (VITAMIN D-3) 5000 unit tablet Take one tablet by mouth daily.   ? cyanocobalamin (VITAMIN B-12, RUBRAMIN) 1,000 mcg/mL injection Inject 1 mL into the muscle every 30 days.   ? ERGOcalciferoL (vitamin D2) (DRISDOL) 1,250 mcg (50,000 unit) capsule Take one capsule by mouth every 7 days.   ? fluticasone propionate (FLONASE) 50 mcg/actuation nasal spray, suspension Apply two sprays to each nostril as directed daily. Shake bottle gently before using.   ? lidocaine hcl (XYLOCAINE) 2 % mucosal jelly Apply  topically to affected area as Needed.   ? lipase-protease-amylase (CREON 24000) 24000-76000-120000 units capsule Take two capsules by mouth daily. Take 1 capsule with snack.   ? magnesium oxide (MAG-OX) 400 mg (241.3 mg magnesium) tablet Take 250 mg by mouth daily.   ? omeprazole DR (PRILOSEC) 20 mg capsule Take one capsule by mouth twice daily.   ? POTASSIUM-99 PO Take 1 capsule by mouth daily.   ? promethazine (PHENERGAN) 25 mg tablet Take one tablet by mouth every 6 hours as needed for Nausea or Vomiting.   ? thiamine (VITAMIN B-1) 50 mg tablet Take six tablets by mouth daily.   ? vitamin A 10,000 unit capsule Take one capsule by mouth daily.   ? vitamins, multi w/ A,D,E,K (AQUADEKS) 100-350-5 mcg-mcg-mg chew tablet Chew one tablet by mouth daily.   ? zinc sulfate 220 mg (50 mg elemental zinc) capsule Take one capsule by mouth daily. Begin after completing copper supplementation     Review of Systems:  Negative except as above    Physical Exam:  Vital Signs: Last Filed In 24 Hours Vital Signs: 24 Hour Range   BP: 117/73 (07/22 2200)  Temp: 36.3 ?C (97.3 ?F) (07/22 2154)  Pulse: 59 (07/22 2200)  Respirations: 18 PER MINUTE (07/22 2200)  SpO2: 96 % (07/22 2200)  O2 Device: None (Room air) (07/22 2200)  Height: 165.1 cm (5' 5) (07/22 2154) BP: (106-117)/(73-80)   Temp:  [36.3 ?C (97.3 ?F)]   Pulse:  [59-63]   Respirations:  [12 PER MINUTE-18 PER MINUTE]   SpO2:  [93 %-96 %]   O2 Device: None (Room air)   Intensity Pain Scale (Self Report): 5 (04/05/22 2154)      Physical Exam  General: Alert & Oriented, mild distress  Respiratory: Respirations are non-labored, symmetric chest wall expansion  Cardiovascular: Peripheral pulses palpable, normal peripheral perfusion, no swelling  Gastrointestinal: soft, non-distended, well healed surgical scars, TTP in epigastric region and RLQ  Integumentary: Warm, No pallor  Neurologic: Alert, oriented, normal sensory, no focal defects  Cognition and speech: oriented, speech clear and coherent  Psychiatric: cooperative, appropriate mood and affect    Lab/Radiology/Other Diagnostic Tests:  Pertinent labs reviewed     CT 7/22   1. There is significant twisting of the mesenteric vessels in the right lower quadrant which is new from the prior examination.  There is involvement of appears to be the distal small bowel.  There were several small bowel loops with thickened walls.  There is edema involving the mesentery in the pelvis.  No significant dilation is identified at this time.  The findings are suspicious for volvulus with mesenteric congestion and small bowel edema.  Early ischemic changes cannot be excluded.  Surgical consultation is recommended.  2.  Prominent enteric nodes, slightly increased from the prior examination.  3.  Small amount of free pelvic fluid.  4.  Diffuse anasarca.  5.  There is a small bowel anastomosis.  There is mild thickening of the small bowel adjacent to this anastomosis.  This could be secondary to the above findings on represent a focal inflammatory or infectious enteritis.          Harlow Mares, DO  Pager

## 2022-04-06 NOTE — Progress Notes
Critical Care Progress Note          Date of Service:  04/06/2022  Name:  Katie Rivera                       MRN:  [id number]   Admission Date: 04/05/2022  LOS: 1 day                       HPI: .68F w/ severe malnutrition, NASH s/p BPD-DS (2016) w/ concern for internal hernia on outside CT s/p negative diagnostic lap (7/22)       Assessment/Plan:   Principal Problem:    Small bowel obstruction (HCC)      Neuro   Acute post-op  pain   -MMPC: Tylenol q4h PRN, Dilaudid 0.5 mg q4h PRN, Oxycodone 5 mg q4h PRN     CV   HDS. See VS summary below  Continue to monitor    Pulm  No acute concerns   -- satting well on RA  Continue to monitor     GI/FEN  Hx of severe malnutrition  Hx of NASH s/p BPD-DS  S/p negative diagnostic lap 7/22  No evidence of ischemic bowel during lap   -Clear liquid diet   -Bowel regimen  -Continue PTA Creon   -Med rec   -PTA Scopolamine patch  Zofran prn nausea  Monitor and replace lytes prn     Nutrition: Dietitian Documentation Nutrition:                                                 GU    Voiding without difficulty  UOP /24hrs  Cr 0.98   continue to monitor         Heme/ID  Acute blood loss anemia  Chronic leukopenia   -- Hgb 7.5. Hgb low, but no clinical signs of overt bleeding. Continue to trend serially. Optimize fluid balance. Monitor stools for signs of occult GI bleeding.  -WBC 4.4, continue to monitor     Endo  No acute issues. Monitor and treat prn    MSK  Impaired Mobility and ADLs  -- PT/OT    PPx  SCDs, Lovenox      Disp - Med/surg   SW/CM following for discharge planning needs.     Patient discussed with Dr. Durene Cal during rounds.     __________________________________________________________________________________  Subjective:  No acute events overnight. Pt's pain well controlled.      __________________________________________________________________________________  Objective:  Medications:  Scheduled Meds:enoxaparin (LOVENOX) syringe 40 mg, 40 mg, Subcutaneous, QDAY(21)  lipase-protease-amylase (CREON 24000) capsule 2 capsule, 2 capsule, Oral, QDAY  pantoprazole (PROTONIX) injection 40 mg, 40 mg, Intravenous, QDAY  scopolamine (TRANSDERM-SCOP) 1mg  over 3 days patch 1 patch, 1 patch, Transdermal, Q72H*   And  Verification of Patch Placement and Integrity - Scopolamine base 1 mg/3 days 1 patch, 1 patch, Transdermal, BID    Continuous Infusions:  PRN and Respiratory Meds:acetaminophen Q4H PRN, HYDROmorphone (DILAUDID) injection Q4H PRN, ondansetron (ZOFRAN) IV Once PRN, ondansetron (ZOFRAN) IV Q6H PRN, oxyCODONE Q4H PRN, polyethylene glycol 3350 QDAY PRN, prochlorperazine edisylate Q6H PRN, promethazine Q6H PRN                     Vital Signs: Last Filed  Vital Signs: 24 Hour Range   BP: 102/58 (07/23 0800)  Temp: 36.4 ?C (97.6 ?F) (07/23 0800)  Pulse: 46 (07/23 0800)  Respirations: 13 PER MINUTE (07/23 0800)  SpO2: 100 % (07/23 0800)  O2 Device: None (Room air) (07/23 0900)  O2 Liter Flow: 2 Lpm (07/23 0800)  Height: 165.1 cm (5' 5) (07/22 2154)  Weight: 78 kg (171 lb 15.3 oz) (07/22 2154)  BP: (96-117)/(56-80)   Temp:  [36.3 ?C (97.3 ?F)-36.7 ?C (98 ?F)]   Pulse:  [46-71]   Respirations:  [10 PER MINUTE-22 PER MINUTE]   SpO2:  [86 %-100 %]   O2 Device: None (Room air)  O2 Liter Flow: 2 Lpm    Intensity Pain Scale (Self Report): (not recorded) Vitals:    04/05/22 2145 04/05/22 2154   Weight: 78 kg (171 lb 15.3 oz) 78 kg (171 lb 15.3 oz)       Critical Care Vitals:      ICP Monitoring:     PA  Catheter:     Hemodynamics/Oxycalcs:       Intake/Output Summary:  (Last 24 hours)    Intake/Output Summary (Last 24 hours) at 04/06/2022 0931  Last data filed at 04/06/2022 0900  Gross per 24 hour   Intake 650 ml   Output 1385 ml   Net -735 ml         Physical Exam:    General: Alert & Oriented, mild distress  Respiratory: Respirations are non-labored, symmetric chest wall expansion  Cardiovascular: Peripheral pulses palpable, normal peripheral perfusion, no swelling  Gastrointestinal: soft, non-distended, well healed surgical scars, TTP in epigastric region and RLQ  Integumentary: Warm, No pallor  Neurologic: Alert, oriented, normal sensory, no focal defects  Cognition and speech: oriented, speech clear and coherent  Psychiatric: cooperative, appropriate mood and affect      Artificial airway:  None                                                                                       Ventilator/ Respiratory Therapy:  No   Vent weaning trial:  Not applicable  Drains: None    Prophylaxis Review:  Lines:  No  Urinary Catheter:  No If yes:   Antibiotic Usage:  No  VTE: VTE: Mechanical prophylaxis: SCDs, Chemoprophylaxis: Lovenox      Lab Review:    24-hour labs:    Results for orders placed or performed during the hospital encounter of 04/05/22 (from the past 24 hour(s))   TYPE & CROSSMATCH    Collection Time: 04/05/22  9:48 PM   Result Value Ref Range    Units Ordered 0     Crossmatch Expires 04/08/2022,2359     Record Check FOUND     ABO/RH(D) O POS     Antibody Screen NEG     Electronic Crossmatch YES    LACTIC ACID(LACTATE)    Collection Time: 04/05/22  9:48 PM   Result Value Ref Range    Lactic Acid 0.7 0.5 - 2.0 MMOL/L   CBC AND DIFF    Collection Time: 04/05/22  9:48 PM   Result Value Ref Range    White  Blood Cells 3.8 (L) 4.5 - 11.0 K/UL    RBC 3.15 (L) 4.0 - 5.0 M/UL    Hemoglobin 9.0 (L) 12.0 - 15.0 GM/DL    Hematocrit 60.7 (L) 36 - 45 %    MCV 92.5 80 - 100 FL    MCH 28.6 26 - 34 PG    MCHC 30.9 (L) 32.0 - 36.0 G/DL    RDW 37.1 (H) 11 - 15 %    Platelet Count 126 (L) 150 - 400 K/UL    MPV 11.0 7 - 11 FL    Neutrophils 47 41 - 77 %    Lymphocytes 43 24 - 44 %    Monocytes 7 4 - 12 %    Eosinophils 2 0 - 5 %    Basophils 1 0 - 2 %    Absolute Neutrophil Count 1.80 1.8 - 7.0 K/UL    Absolute Lymph Count 1.63 1.0 - 4.8 K/UL    Absolute Monocyte Count 0.26 0 - 0.80 K/UL    Absolute Eosinophil Count 0.06 0 - 0.45 K/UL    Absolute Basophil Count 0.02 0 - 0.20 K/UL PROTIME INR (PT)    Collection Time: 04/05/22  9:48 PM   Result Value Ref Range    Protime 13.6 9.5 - 14.2 SEC    INR 1.2 0.8 - 1.2   COMPREHENSIVE METABOLIC PANEL    Collection Time: 04/05/22  9:48 PM   Result Value Ref Range    Sodium 138 137 - 147 MMOL/L    Potassium 4.3 3.5 - 5.1 MMOL/L    Chloride 115 (H) 98 - 110 MMOL/L    Glucose 105 (H) 70 - 100 MG/DL    Blood Urea Nitrogen 18 7 - 25 MG/DL    Creatinine 0.62 (H) 0.4 - 1.00 MG/DL    Calcium 7.0 (L) 8.5 - 10.6 MG/DL    Total Protein 6.0 6.0 - 8.0 G/DL    Total Bilirubin 0.3 0.3 - 1.2 MG/DL    Albumin 3.2 (L) 3.5 - 5.0 G/DL    Alk Phosphatase 694 (H) 25 - 110 U/L    AST (SGOT) 14 7 - 40 U/L    CO2 14 (L) 21 - 30 MMOL/L    ALT (SGPT) 7 7 - 56 U/L    Anion Gap 9 3 - 12    eGFR >60 >60 mL/min   PTT (APTT)    Collection Time: 04/05/22  9:48 PM   Result Value Ref Range    APTT 31.2 24.0 - 36.5 SEC   MAGNESIUM    Collection Time: 04/05/22  9:48 PM   Result Value Ref Range    Magnesium 1.4 (L) 1.6 - 2.6 mg/dL   PHOSPHORUS    Collection Time: 04/05/22  9:48 PM   Result Value Ref Range    Phosphorus 4.3 2.0 - 4.5 MG/DL   BLOOD BANK SAMPLE HOLD    Collection Time: 04/05/22 10:54 PM   Result Value Ref Range    BB Sample hold IN LAB    CBC CELLULAR THERAPEUTICS    Collection Time: 04/06/22  1:22 AM   Result Value Ref Range    White Blood Cells 4.4 (L) 4.5 - 11.0 K/UL    RBC 2.64 (L) 4.0 - 5.0 M/UL    Hemoglobin 7.5 (L) 12.0 - 15.0 GM/DL    Hematocrit 85.4 (L) 36 - 45 %    MCV 92.0 80 - 100 FL    MCH 28.5 26 - 34 PG  MCHC 30.9 (L) 32.0 - 36.0 G/DL    RDW 25.9 (H) 11 - 15 %    Platelet Count 138 (L) 150 - 400 K/UL    MPV 11.9 (H) 7 - 11 FL   COMPREHENSIVE METABOLIC PANEL    Collection Time: 04/06/22  1:22 AM   Result Value Ref Range    Sodium 141 137 - 147 MMOL/L    Potassium 4.9 3.5 - 5.1 MMOL/L    Chloride 119 (H) 98 - 110 MMOL/L    Glucose 102 (H) 70 - 100 MG/DL    Blood Urea Nitrogen 15 7 - 25 MG/DL    Creatinine 5.63 0.4 - 1.00 MG/DL    Calcium 6.0 (L) 8.5 - 10.6 MG/DL    Total Protein 5.1 (L) 6.0 - 8.0 G/DL    Total Bilirubin 0.3 0.3 - 1.2 MG/DL    Albumin 2.6 (L) 3.5 - 5.0 G/DL    Alk Phosphatase 875 (H) 25 - 110 U/L    AST (SGOT) 19 7 - 40 U/L    CO2 15 (L) 21 - 30 MMOL/L    ALT (SGPT) 5 (L) 7 - 56 U/L    Anion Gap 7 3 - 12    eGFR >60 >60 mL/min   MAGNESIUM  CELLULAR THERAPEUTICS    Collection Time: 04/06/22  1:22 AM   Result Value Ref Range    Magnesium 1.3 (L) 1.6 - 2.6 mg/dL   PHOSPHORUS  CELLULAR THERAPEUTICS    Collection Time: 04/06/22  1:22 AM   Result Value Ref Range    Phosphorus 4.1 2.0 - 4.5 MG/DL     Point of Care Testing:  (Last 24 hours):  Glucose: (!) 102 (04/06/22 0122)    Radiology and Other Diagnostic Procedures Review:    Pertinent radiology reviewed.    Durel Salts, MD  Pager

## 2022-04-07 ENCOUNTER — Encounter: Admit: 2022-04-07 | Discharge: 2022-04-07

## 2022-04-07 ENCOUNTER — Inpatient Hospital Stay
Admit: 2022-04-06 | Discharge: 2022-04-07 | Disposition: A | Discharge status: Home / Self Care | Payer: Medicaid Other | Source: Other Acute Inpatient Hospital | Attending: Surgical Critical Care

## 2022-04-07 VITALS — Wt 169.4 lb

## 2022-04-07 DIAGNOSIS — E509 Vitamin A deficiency, unspecified: Secondary | ICD-10-CM

## 2022-04-07 DIAGNOSIS — D509 Iron deficiency anemia, unspecified: Secondary | ICD-10-CM

## 2022-04-07 DIAGNOSIS — K7460 Unspecified cirrhosis of liver: Secondary | ICD-10-CM

## 2022-04-07 DIAGNOSIS — E8809 Other disorders of plasma-protein metabolism, not elsewhere classified: Secondary | ICD-10-CM

## 2022-04-07 DIAGNOSIS — N179 Acute kidney failure, unspecified: Secondary | ICD-10-CM

## 2022-04-07 DIAGNOSIS — Z7409 Other reduced mobility: Secondary | ICD-10-CM

## 2022-04-07 DIAGNOSIS — K7210 Chronic hepatic failure without coma: Secondary | ICD-10-CM

## 2022-04-07 DIAGNOSIS — K760 Fatty (change of) liver, not elsewhere classified: Secondary | ICD-10-CM

## 2022-04-07 DIAGNOSIS — E669 Obesity, unspecified: Secondary | ICD-10-CM

## 2022-04-07 DIAGNOSIS — M810 Age-related osteoporosis without current pathological fracture: Secondary | ICD-10-CM

## 2022-04-07 DIAGNOSIS — E43 Unspecified severe protein-calorie malnutrition: Secondary | ICD-10-CM

## 2022-04-07 DIAGNOSIS — Z9889 Other specified postprocedural states: Secondary | ICD-10-CM

## 2022-04-07 DIAGNOSIS — Z9884 Bariatric surgery status: Secondary | ICD-10-CM

## 2022-04-07 DIAGNOSIS — Z9049 Acquired absence of other specified parts of digestive tract: Secondary | ICD-10-CM

## 2022-04-07 DIAGNOSIS — K909 Intestinal malabsorption, unspecified: Secondary | ICD-10-CM

## 2022-04-07 DIAGNOSIS — D61818 Other pancytopenia: Secondary | ICD-10-CM

## 2022-04-07 DIAGNOSIS — D62 Acute posthemorrhagic anemia: Secondary | ICD-10-CM

## 2022-04-07 DIAGNOSIS — D72819 Decreased white blood cell count, unspecified: Secondary | ICD-10-CM

## 2022-04-07 DIAGNOSIS — K7682 Hepatic encephalopathy syndrome (HCC): Secondary | ICD-10-CM

## 2022-04-07 DIAGNOSIS — H409 Unspecified glaucoma: Secondary | ICD-10-CM

## 2022-04-07 DIAGNOSIS — M1990 Unspecified osteoarthritis, unspecified site: Secondary | ICD-10-CM

## 2022-04-07 LAB — COMPREHENSIVE METABOLIC PANEL
ALBUMIN: 3.1 g/dL — ABNORMAL LOW (ref 3.5–5.0)
ALK PHOSPHATASE: 224 U/L — ABNORMAL HIGH (ref 25–110)
ALT: 7 U/L (ref 7–56)
ANION GAP: 7 (ref 3–12)
AST: 11 U/L (ref 7–40)
BLD UREA NITROGEN: 12 mg/dL — ABNORMAL HIGH (ref 7–25)
CALCIUM: 7.8 mg/dL — ABNORMAL LOW (ref 8.5–10.6)
CHLORIDE: 113 MMOL/L — ABNORMAL HIGH (ref 98–110)
CO2: 21 MMOL/L (ref 21–30)
CREATININE: 1.2 mg/dL — ABNORMAL HIGH (ref 0.4–1.00)
EGFR: 49 mL/min — ABNORMAL LOW (ref >60)
GLUCOSE,PANEL: 75 mg/dL — ABNORMAL LOW (ref 70–100)
POTASSIUM: 4.4 MMOL/L (ref 3.5–5.1)
TOTAL BILIRUBIN: 0.3 mg/dL (ref 0.3–1.2)
TOTAL PROTEIN: 6.5 g/dL (ref 6.0–8.0)

## 2022-04-07 LAB — ECG 12-LEAD
P AXIS: 19 degrees
P-R INTERVAL: 168 ms
Q-T INTERVAL: 442 ms
QRS DURATION: 96 ms
QTC CALCULATION (BAZETT): 414 ms
R AXIS: -10 degrees
T AXIS: 7 degrees
VENTRICULAR RATE: 53 {beats}/min

## 2022-04-07 LAB — CBC CELLULAR THERAPEUTICS
HEMATOCRIT: 29 % — ABNORMAL LOW (ref 36–45)
HEMOGLOBIN: 9.3 g/dL — ABNORMAL LOW (ref 12.0–15.0)
RBC COUNT: 3.2 M/UL — ABNORMAL LOW (ref 4.0–5.0)
WBC COUNT: 3.2 10*3/uL — ABNORMAL LOW (ref 4.5–11.0)

## 2022-04-07 LAB — IONIZED CALCIUM: IONIZED CALCIUM: 1 MMOL/L (ref 1.0–1.3)

## 2022-04-07 MED ORDER — ONDANSETRON 4 MG PO TBDI
4 mg | ORAL_TABLET | ORAL | 0 refills | 8.00000 days | Status: DC | PRN
Start: 2022-04-07 — End: 2023-07-01
  Filled 2022-04-07: qty 30, 8d supply, fill #1

## 2022-04-07 MED ORDER — ACETAMINOPHEN 325 MG PO TAB
650 mg | ORAL_TABLET | ORAL | 0 refills | Status: AC | PRN
Start: 2022-04-07 — End: ?
  Filled 2022-04-07: qty 30, 3d supply, fill #1

## 2022-04-07 NOTE — Anesthesia Pain Rounding
Anesthesia Follow-Up Evaluation: Post-Procedure Day One    Name: Katie Rivera     MRN: [id number]     DOB: 04-08-1966     Age: 56 y.o.     Sex: female   __________________________________________________________________________     Procedure Date: 04/05/2022   Procedure: Procedure(s):  DIAGNOSTIC LAPAROSCOPY OF ABDOMEN/ PERITONEUM/ OMENTUM WITH/ WITHOUT COLLECTION OF SPECIMENS BY BRUSHING/ WASHING    Physical Assessment  Height: 165.1 cm (5' 5)  Weight: 76.8 kg (169 lb 6.4 oz)    Vital Signs (Last Filed in 24 hours)  BP: 111/62 (07/23 2318)  Temp: 36.7 ?C (98 ?F) (07/23 2318)  Pulse: 60 (07/23 2318)  Respirations: 18 PER MINUTE (07/23 2318)  SpO2: 99 % (07/23 2318)  O2 Device: None (Room air) (07/23 2318)  O2 Liter Flow: 2 Lpm (07/23 0800)    Patient History   Allergies  Allergies   Allergen Reactions   ? Tetanus And Diphtheria Toxoids, Adsorbed, Adult ANAPHYLAXIS        Medications  Scheduled Meds:enoxaparin (LOVENOX) syringe 40 mg, 40 mg, Subcutaneous, QDAY(21)  lipase-protease-amylase (CREON 24000) capsule 2 capsule, 2 capsule, Oral, QDAY  pantoprazole (PROTONIX) injection 40 mg, 40 mg, Intravenous, QDAY  scopolamine (TRANSDERM-SCOP) 1mg  over 3 days patch 1 patch, 1 patch, Transdermal, Q72H*   And  Verification of Patch Placement and Integrity - Scopolamine base 1 mg/3 days 1 patch, 1 patch, Transdermal, BID    Continuous Infusions:  PRN and Respiratory Meds:acetaminophen Q4H PRN, ondansetron (ZOFRAN) IV Q6H PRN, polyethylene glycol 3350 QDAY PRN, prochlorperazine edisylate Q6H PRN, promethazine Q6H PRN      Diagnostic Tests  Hematology:   Lab Results   Component Value Date    HGB 9.3 04/07/2022    HCT 29.4 04/07/2022    PLTCT 115 04/07/2022    WBC 3.2 04/07/2022    NEUT 47 04/05/2022    ANC 1.80 04/05/2022    LYMPH 14 10/27/2017    ALC 1.63 04/05/2022    MONA 7 04/05/2022    AMC 0.26 04/05/2022    EOSA 2 04/05/2022    ABC 0.02 04/05/2022    MCV 91.2 04/07/2022    MCH 28.8 04/07/2022    MCHC 31.6 04/07/2022    MPV 10.9 04/07/2022    RDW 15.1 04/07/2022         General Chemistry:   Lab Results   Component Value Date    NA 141 04/07/2022    K 4.4 04/07/2022    CL 113 04/07/2022    CO2 21 04/07/2022    GAP 7 04/07/2022    BUN 12 04/07/2022    CR 1.27 04/07/2022    GLU 75 04/07/2022    GLU 79 11/01/2018    CA 7.8 04/07/2022    ALBUMIN 3.1 04/07/2022    LACTIC 0.7 04/05/2022    OBSCA 1.09 04/07/2022    MG 1.8 04/07/2022    TOTBILI 0.3 04/07/2022    PO4 4.3 04/07/2022      Coagulation:   Lab Results   Component Value Date    PT 13.6 04/05/2022    PTT 31.2 04/05/2022    INR 1.2 04/05/2022         Follow-Up Assessment  Patient location during evaluation: floor      Anesthetic Complications:   Anesthetic complications: The patient did not experience any anesthestic complications.      Pain:  Score: 7    Management:adequate     Level of Consciousness: awake and alert  Hydration:euvolemic     Airway Patency: patent   Respiratory Status: acceptable and room air     Cardiovascular Status:hemodynamically stable   Regional/Neuroaxial:

## 2022-04-07 NOTE — Progress Notes
Daily Progress Note      Date of Service:  04/07/2022  Name:  Katie Rivera                       MRN:  [id number]   Admission Date: 04/05/2022 (LOS: 2 days)                     Assessment: Katie Rivera is a 56 y.o. female with severe malnutrition, NASH s/p BPD-DS (2016) w/ concern for internal hernia on outside CT s/p negative diagnostic lap (7/22)     Principal Problem:    Small bowel obstruction (HCC)      Plan:  Regular diet  MMPC    Dispo: Discharge home today      _____________________________________________________________________________    Subjective:  No acute events overnight. Pain controlled. Tolerating diet and having regular bowel function.    Objective:  BP: (98-119)/(58-73)   Temp:  [36.4 ?C (97.6 ?F)-36.9 ?C (98.5 ?F)]   Pulse:  [46-63]   Respirations:  [13 PER MINUTE-18 PER MINUTE]   SpO2:  [98 %-100 %]   O2 Device: None (Room air)  O2 Liter Flow: 2 Lpm  Body mass index is 28.19 kg/m?Marland Kitchen  BMI Category: Overweight (25 to <30)    Anemia: Yes - acute blood loss  Renal Function: AKI  Acidosis:Normal  Electrolyte Abnormalities:    Sodium: Normal   Potassium: Normal   Magnesium:Hypomagnesemia present (Mg<2.0 mg/dL)   Phosphorous:Normal  Calcium:Serum Hypocalcemia present (Ca<8.5 mg/dL)    Lab Results   Component Value Date/Time    HGB 7.5 (L) 04/06/2022 01:22 AM    HCT 24.3 (L) 04/06/2022 01:22 AM    WBC 4.4 (L) 04/06/2022 01:22 AM    PLTCT 138 (L) 04/06/2022 01:22 AM    INR 1.2 04/05/2022 09:48 PM     Lab Results   Component Value Date/Time    NA 141 04/06/2022 01:22 AM    K 4.9 04/06/2022 01:22 AM    CL 119 (H) 04/06/2022 01:22 AM    CO2 15 (L) 04/06/2022 01:22 AM    BUN 15 04/06/2022 01:22 AM    CR 0.98 04/06/2022 01:22 AM    MG 1.3 (L) 04/06/2022 01:22 AM    PO4 4.1 04/06/2022 01:22 AM    CA 6.0 (L) 04/06/2022 01:22 AM     Lab Results   Component Value Date/Time    GLUPOC 59 (L) 01/09/2018 06:35 AM    GLUPOC 56 (L) 01/09/2018 04:41 AM    GLUPOC 58 (L) 01/08/2018 07:47 AM          Physical Exam  Constitutional:       Appearance: She is obese.   HENT:      Head: Normocephalic and atraumatic.      Nose: Nose normal.   Eyes:      Extraocular Movements: Extraocular movements intact.      Pupils: Pupils are equal, round, and reactive to light.   Pulmonary:      Effort: Pulmonary effort is normal.   Abdominal:      General: There is no distension.      Palpations: Abdomen is soft.      Tenderness: There is no abdominal tenderness.      Comments: Incisions CDI   Musculoskeletal:         General: Normal range of motion.      Cervical back: Normal range of motion.   Skin:  General: Skin is warm and dry.   Neurological:      General: No focal deficit present.      Mental Status: She is alert and oriented to person, place, and time.   Psychiatric:         Mood and Affect: Mood normal.         Behavior: Behavior normal.                                               Active Wounds  Wounds 04/06/22 0108 Abdomen (Active)   04/06/22 0108 Abdomen   Wound Type:    Pressure Injury Stages (For Pressure Injury Wound Type Only):    Pressure Injury Present On Inpatient Admission:    If this pressure injury is suspected to be device related, please select the device::    Wound/Pressure Injury Orientation:    Wound Location Comments: Abdomen noted with three sites   ZXWound Location:    Wound Description (Comments):    Wound Type::    Wound Dressing Status Intact 04/06/22 2000   Wound Dressing and / or Treatment Dermabond 04/06/22 2000   Wound Drainage Amount None 04/06/22 2000   Wound Base Assessment Clean;Pink;Red 04/06/22 2000   Surrounding Skin Assessment Dry;Intact 04/06/22 2000   Wound Site Closure Approximated 04/06/22 2000   Number of days: 1                                    ___________________________    Rocky Morel, MD   Service Pager: # [phone]

## 2022-04-09 NOTE — Discharge Instructions - Pharmacy
Discharge Summary      Name: Katie Rivera  Medical Record Number: [id number]        Account Number:  [id number]  Date Of Birth:  1965-12-30                         Age:  56 y.o.  Admit date:  04/05/2022                     Discharge date: 04/07/22      Discharge Attending:  Georges Mouse, MD  Discharge Summary Completed By: Rocky Morel, MD    Service: Surgery- Emergency General [phone]    Reason for hospitalization:  Small bowel obstruction [facility]) [K56.609]    Primary Discharge Diagnosis:   Small bowel obstruction [facility])      Hospital Diagnoses:  Hospital Problems        Active Problems    * (Principal) Small bowel obstruction (HCC)     Significant Past Medical History        Arthritis      Comment:  Right knee  Chronic liver failure (HCC)  Cirrhosis (HCC)  Glaucoma  Hepatic encephalopathy syndrome (HCC)  Hepatic steatosis  Hepatic steatosis  History of abdominal paracentesis  Hypoalbuminemia  Iron deficiency anemia  Malabsorption syndrome  Nonalcoholic fatty liver disease  Obesity  Osteoporosis  Severe malnutrition (HCC)  Vitamin A deficiency    Allergies   Tetanus and diphtheria toxoids, adsorbed, adult    Brief Hospital Course   The patient was admitted and the following issues were addressed during this hospitalization: (with pertinent details including admission exam/imaging/labs).  Patient was admitted on 7/22 with concerns for small bowel obstruction. She underwent exploratory laparotomy on 7/23 with no obstruction found.  Her diet was advanced and she was discharged home on 7/24. Patient was discharged in stable condition, afebrile, with good bowel and urinary function, ambulating, tolerating regular diet and PO medications.    Items Needing Follow Up   Pending items or areas that need to be addressed at follow up: none    Pending Labs and Follow Up Radiology    Pending labs and/or radiology review at this time of discharge are listed below: if this area is blank, there are no items for review.         Medications Medication List      START taking these medications    ? acetaminophen 325 mg tablet; Commonly known as: TYLENOL; Dose: 650 mg;   Take two tablets by mouth every 4 hours as needed.; Quantity: 30 tablet;   Refills: 0  ? ondansetron 4 mg rapid dissolve tablet; Commonly known as: ZOFRAN ODT;   Dose: 4 mg; Dissolve one tablet by mouth every 6 hours as needed for   Nausea or Vomiting. Place on tongue to dissolve.; Quantity: 30 tablet;   Refills: 0     CONTINUE taking these medications    ? CALCIUM CITRATE + D PO; Dose: 1 tablet; Refills: 0  ? cyanocobalamin (vitamin B-12) 1,000 mcg/mL injection solution; Commonly   known as: RUBRAMIN PC; Dose: 1 mL; Refills: 0  ? ERGOcalciferoL (vitamin D2) 1,250 mcg (50,000 unit) capsule; Commonly   known as: DRISDOL; Dose: 1 capsule; Refills: 0  ? fluticasone propionate 50 mcg/actuation nasal spray, suspension;   Commonly known as: FLONASE; Dose: 2 spray; Refills: 0  ? lidocaine hcl 2 % mucosal jelly; Commonly known as: XYLOCAINE; Refills:   0  ? lipase-protease-amylase  24000-76000-120000 units capsule; Commonly known   as: CREON 24000; Dose: 2 capsule; Refills: 0  ? magnesium oxide 400 mg (241.3 mg magnesium) tablet; Commonly known as:   MAGOX; Dose: 250 mg; Refills: 0  ? omeprazole DR 20 mg capsule; Commonly known as: PriLOSEC; Dose: 20 mg;   Refills: 0  ? POTASSIUM-99 PO; Dose: 1 capsule; Refills: 0  ? promethazine 25 mg tablet; Commonly known as: PHENERGAN; Dose: 25 mg;   Refills: 0  ? vitamin A 3,000 mcg (10,000 unit) capsule; Dose: 10,000 Units; Refills:   0  ? VITAMIN B-1 50 mg tablet; Generic drug: thiamine HCl (vitamin B1); Dose:   300 mg; Refills: 0  ? VITAMIN C PO; Refills: 0  ? VITAMIN D3 5000 unit tablet; Generic drug: CHOLEcalciferoL (vitamin D3);   Dose: 5,000 Units; Refills: 0  ? vitamins, multi w/ A,D,E,K 100-350-5 mcg-mcg-mg chewable tablet;   Commonly known as: AQUADEKS; Dose: 1 tablet; Chew one tablet by mouth   daily.; Quantity: 30 tablet; Refills: 1  ? zinc sulfate 220 mg (50 mg elemental zinc) capsule; Dose: 220 mg; Take   one capsule by mouth daily. Begin after completing copper supplementation;   Quantity: 30 capsule; Refills: 1       Return Appointments and Scheduled Appointments     Scheduled appointments:    Jun 13, 2022  1:20 PM  Office visit with Heywood Footman, DO  Nephrology: [facility] (Internal Medicine) [address]  [address]  [place] [address]  [phone]   Oct 01, 2022  9:15 AM  (Arrive by 9:00 AM)  US ABDOMEN LIMITED with [facility]  Imaging, Ultrasound: [facility] Radiology) [address]  [address]  [place] [address]  [phone]   Oct 01, 2022 11:00 AM  Office visit with Bertram Savin, APRN-NP  Hepatology: [facility] ([facility] [place]) [address]  [address]  [place] [address]  [phone]        Things you need to do     Follow up with HuntingtonEfraim Kaufmann, [place]    Phone: [phone]    Where: [address], [place] [address]        Consults, Procedures, Diagnostics, Micro, Pathology   Consults: None  Surgical Procedures & Dates: noted in brief hospital course  Significant Diagnostic Studies, Micro and Procedures: none and noted in brief hospital course  Significant Pathology: noted in brief hospital course  Nutrition:                                                                             Discharge Disposition, Condition   Patient Disposition: Home or Self Care [01]  Condition at Discharge: Stable    Code Status     Code Status History     Date Active Date Inactive Code Status Order ID          04/05/2022 [id number] 04/07/2022 1214 Full Code [id number]  Harlow Mares, DO Inpatient    02/13/2018 2347 02/15/2018 1741 Full Code [id number]  Maia Plan., MD Inpatient    Only showing the last 2 code statuses.  Patient Instructions     Activity     Activity as Tolerated   As directed      It is important to keep increasing your activity level after you leave the hospital.  Moving around can help prevent blood clots, lung infection (pneumonia) and other problems.  Gradually increasing the number of times you are up moving around will help you return to your normal activity level more quickly.  Continue to increase the number of times you are up to the chair and walking daily to return to your normal activity level. Begin to work towards your normal activity level at discharge.      Diet     Regular Diet   As directed      You have no dietary restriction. Please continue with a healthy balanced diet.      Wounds/Lines/Drains     Wound Care   As directed      Keep wound clean and dry.  Do not submerge under water.      Scheduled appointments:    Jun 13, 2022  1:20 PM  Office visit with Heywood Footman, DO  Nephrology: [facility] (Internal Medicine) [address]  [address]  [place] [address]  [phone]   Oct 01, 2022  9:15 AM  (Arrive by 9:00 AM)  US ABDOMEN LIMITED with [facility]  Imaging, Ultrasound: [facility]iology) [address] [facility] [address]  [address]  [place] [address]  [phone]   Oct 01, 2022 11:00 AM  Office visit with Bertram Savin, APRN-NP  Hepatology: [facility] ([facility] [place]) [address]  [address]  [place] [address]  [phone]          Signs and Symptoms:   Report these signs and symptoms     Discharge Signs/Symptoms   As directed      Standard Please contact your doctor if you have any of the following symptoms: uncontrolled pain or unable to have bowel movement          Additional Orders: Case Management, Supplies, Home Health     Home Health/DME     None            Signed:  Rocky Morel, MD  04/09/2022      cc:  Primary Care Physician:  Rockwell Germany   Verified    Referring physicians:  Unknown, Unknown, MD   Additional provider(s):        Did we miss something? If additional records are needed, please fax a request on office letterhead to [phone]. Please include the patient's name, date of birth, fax number and type of information needed. Additional request can be made by email at [email or web address]. For general questions of information about electronic records sharing, call [phone].

## 2022-04-11 ENCOUNTER — Encounter: Admit: 2022-04-11 | Discharge: 2022-04-11

## 2022-04-14 ENCOUNTER — Encounter: Admit: 2022-04-14 | Discharge: 2022-04-14

## 2022-05-28 ENCOUNTER — Encounter: Admit: 2022-05-28 | Discharge: 2022-05-28

## 2022-05-28 NOTE — Telephone Encounter
Pt. Called stating she wanted to know the purpose of this visit with Dr. Esmeralda Arthur on 9/29. Informed she was referred to Dr. Esmeralda Arthur by [facility] d/t increase in serum creatinine. Would be for evaluation. Pt. States d/t covid outbreak, she is wanting to reschedule. Offered 10/27. Pt. Agreeable. RN rescheduled.

## 2022-07-04 ENCOUNTER — Encounter: Admit: 2022-07-04 | Discharge: 2022-07-04

## 2022-09-04 ENCOUNTER — Encounter: Admit: 2022-09-04 | Discharge: 2022-09-04

## 2023-05-14 ENCOUNTER — Encounter: Admit: 2023-05-14 | Discharge: 2023-05-14

## 2023-05-15 ENCOUNTER — Encounter: Admit: 2023-05-15 | Discharge: 2023-05-15

## 2023-05-15 NOTE — Telephone Encounter
Kipp Brood I rec'd a new referral on a current pt of Dr. Tasia Catchings last seen [facility] 01/2022.  Please call pt and sched f/u appt.. I have added new info into Dr. Marcelino Freestone provider folder.  Thanks

## 2023-06-14 IMAGING — CT ABDOMEN_PELVIS W(Adult)
2 of 3 series · 10 of 46 positions shown, 11 images · non-contrast
Comparison: none

[Series 2: abdomen_pelvis ax 3.00 br40 s3 · axial · 0.49mm/px · z∈[+1462,+1831]mm · 7 of 153 slices shown, 8 images]
[im 15/153  soft-tissue]
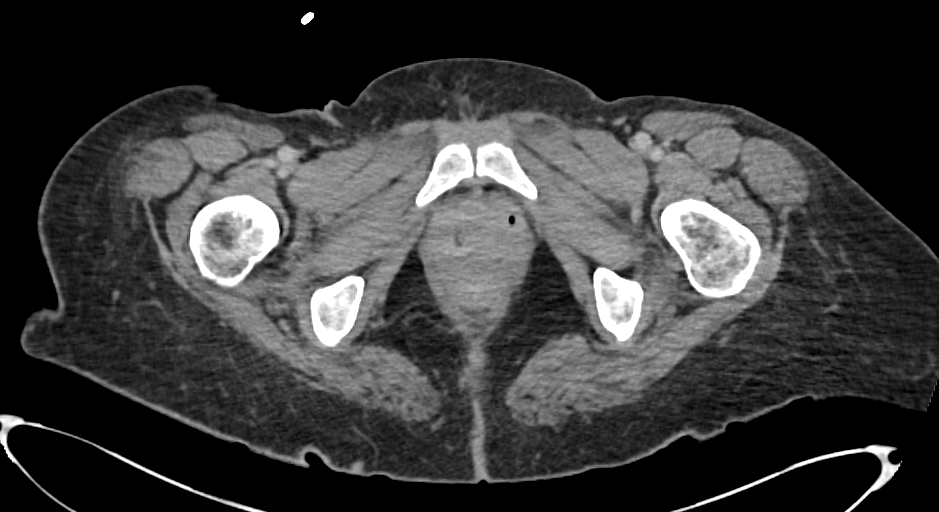
[im 15/153  bone]
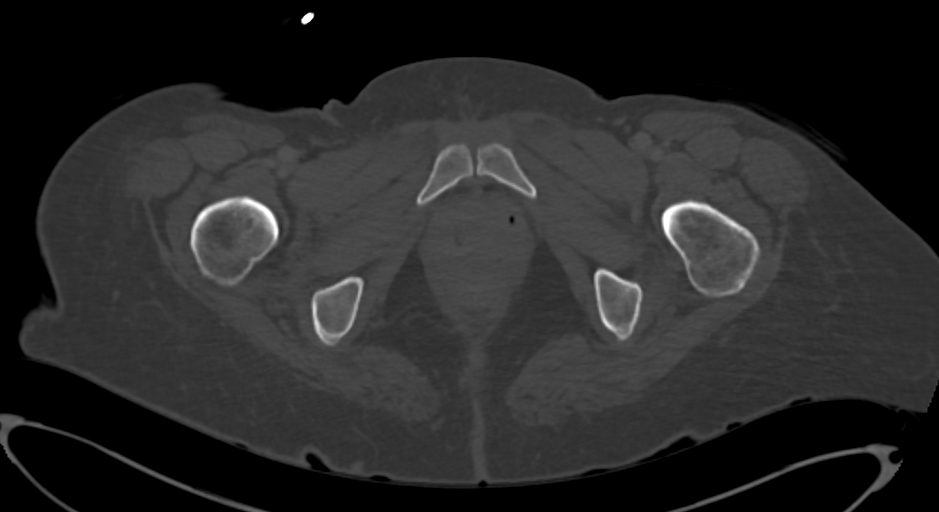
[im 35/153  soft-tissue]
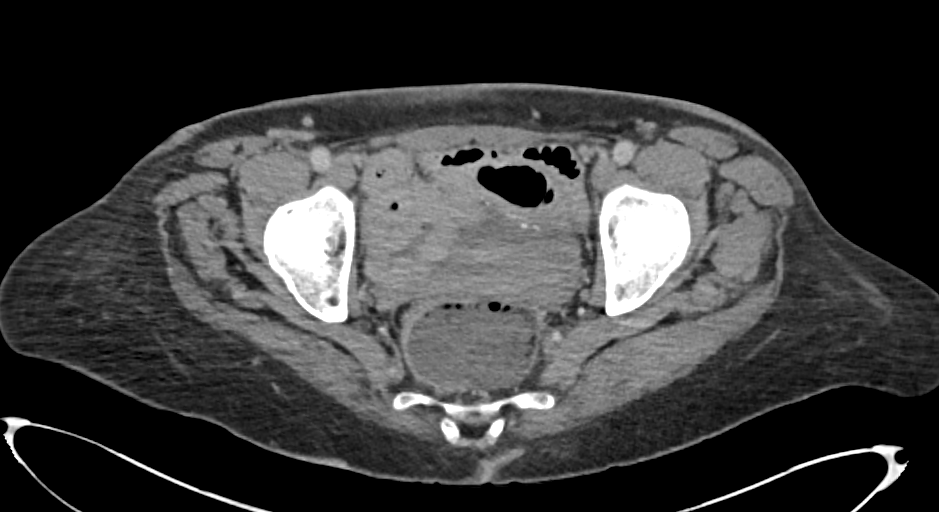
[im 54/153  soft-tissue]
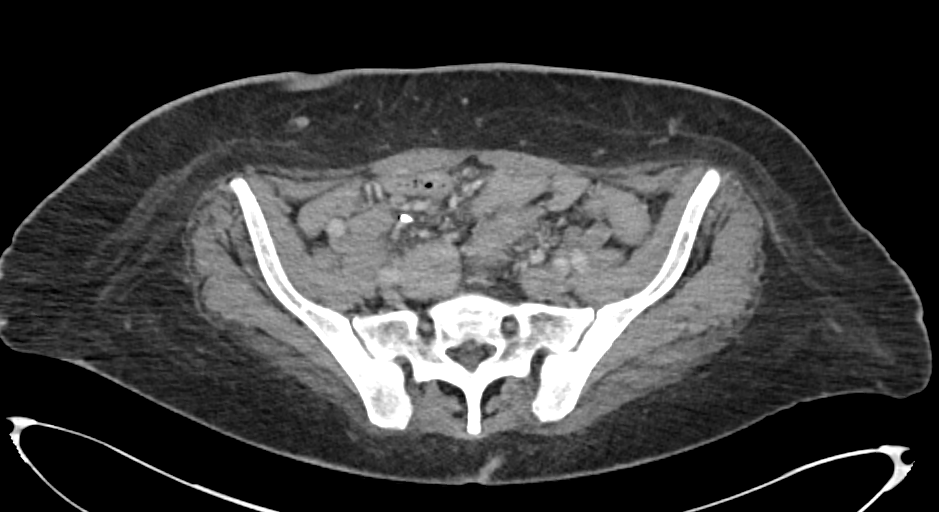
[im 79/153  soft-tissue]
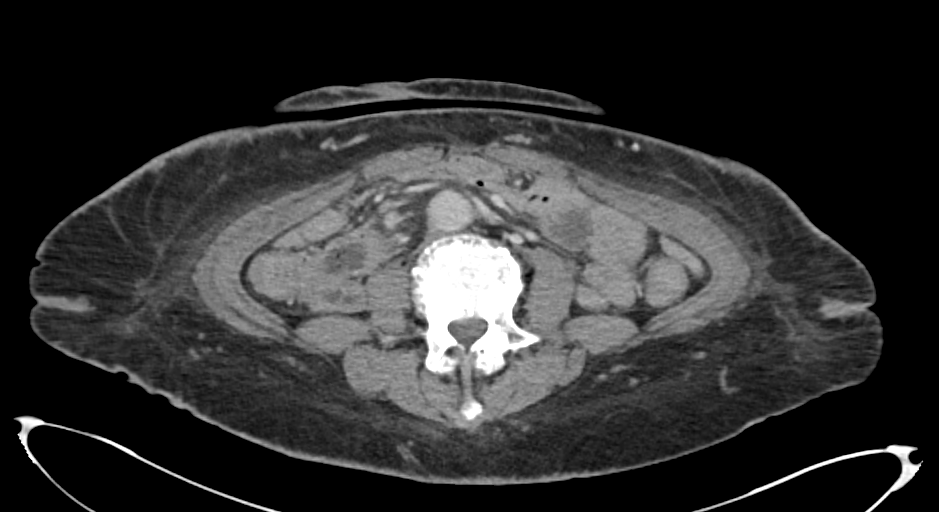
[im 99/153  soft-tissue]
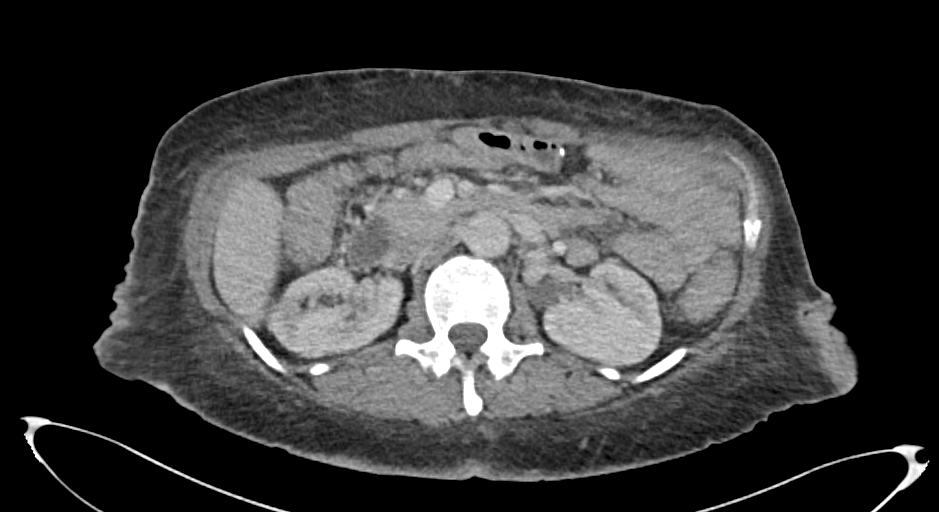
[im 118/153  soft-tissue]
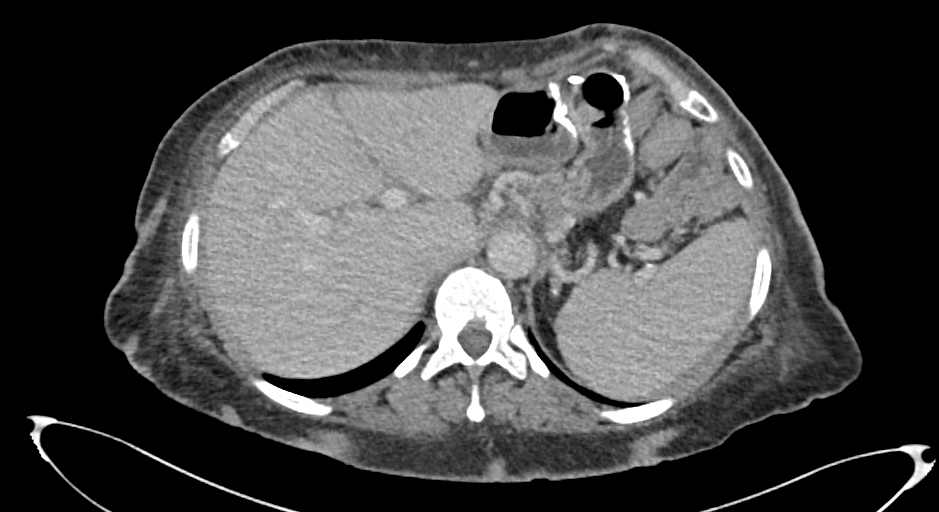
[im 138/153  soft-tissue]
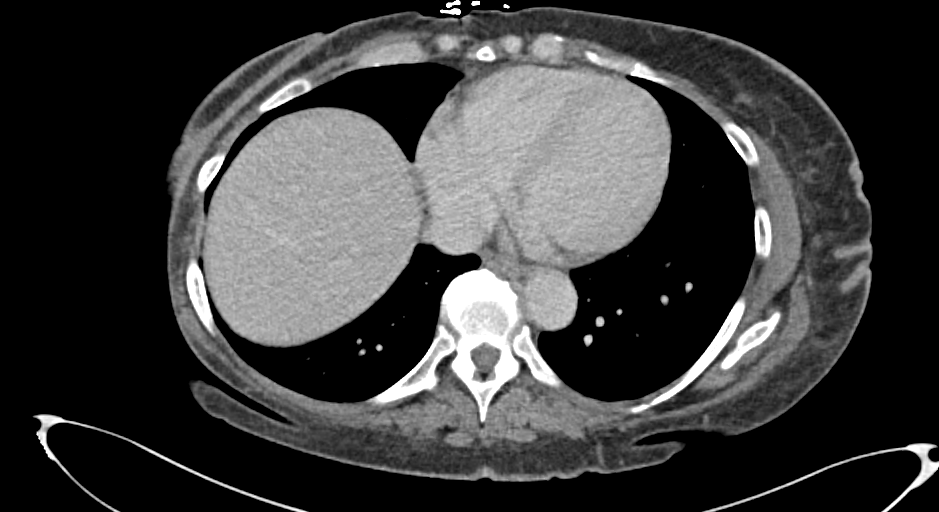

[Series 4: abdomen_pelvis cor 3.00 br40 s3 · coronal · 0.90mm/px · 3 of 83 slices shown]
[im 28/83  soft-tissue]
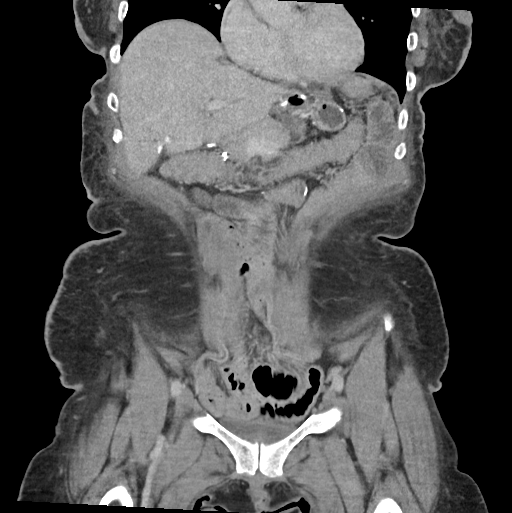
[im 37/83  soft-tissue]
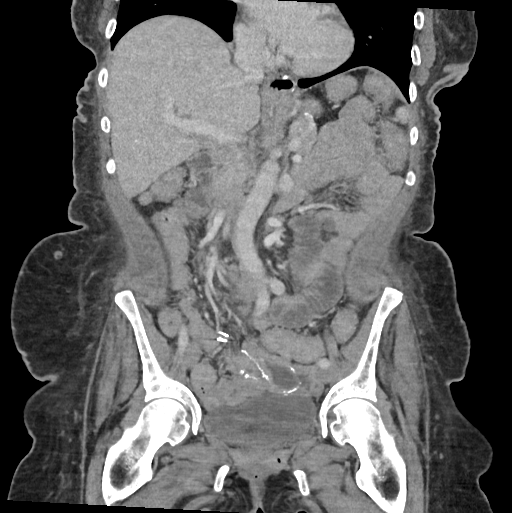
[im 46/83  soft-tissue]
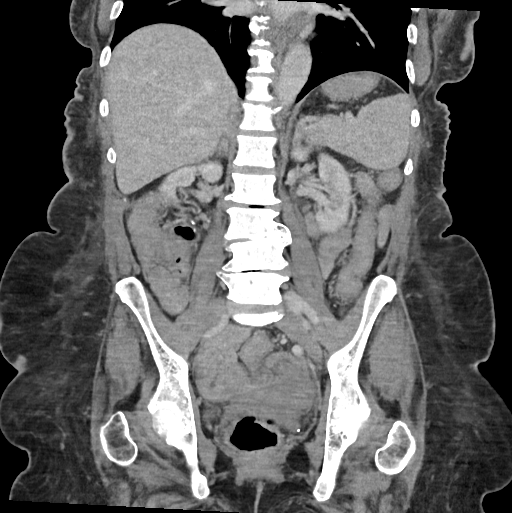

[10 of 46 positions shown; findings below may reference images not displayed]

DIAGNOSTIC STUDIES

EXAM

CT abd/pel w iv con

INDICATION

continued severe abd pain
PT STATES HAS ABD PAIN, DISCOMFORT. HX OF GASTRIC BYPASS AND FEEDING TUBE. GFR 57. 50ML [id number].
CT/NM 2/0. TJ

TECHNIQUE

All CT scans at this facility use dose modulation, iterative reconstruction, and/or weight based
dosing when appropriate to reduce radiation dose to as low as reasonably achievable.

Number of previous computed tomography exams in the last 12 months is 2.

Number of previous nuclear medicine myocardial perfusion studies in the last 12 months is 0  .

COMPARISONS

April 05, 2022

FINDINGS

Lung bases are clear. Liver is unremarkable. There is mild ectasia of the hepatobiliary system.
Prior cholecystectomy. Spleen is normal. Minimal perisplenic fluid can be seen along the posterior
spleen which is unchanged dating back to December 2021.

Adrenal glands are unremarkable. Kidneys demonstrate tiny hypodensities which are too small to
characterize but likely represent small cysts. Pancreas is unremarkable.

No retroperitoneal adenopathy. Abdominal pelvic vasculature is unchanged.

Prior gastric bypass is seen with anastomosis evident in the left lower quadrant. Previously
reported evidence for mesenteric twisting is re-demonstrated in the right lower quadrant. Associated
mesenteric congestion and thickened loops of bowel however are less pronounced on current exam. The
possibility of internal hernia or volvulus should still be considered. There is a large amount of
fecal material within the rectal vault. No ascites or mesenteric adenopathy.

No pelvic adenopathy is seen. Uterus is either atrophic or has been removed.

Small umbilical hernia is re-demonstrated degenerative changes of the spine are evident.

IMPRESSION

Prior gastric bypass. Whirl sign is evident within the right lower quadrant involving the mesentery
similar to prior exam however the degree of associated mesenteric congestion and bowel wall
thickening is less pronounced. Currently there is no evidence for complete obstruction. Internal
hernia with intermittent partial obstruction however could be considered. Depending on symptoms
surgical consultation may be necessary. Gastrografin could also be administered and followed with
serial KUB to exclude obstruction.

There is a large amount of fecal material in the rectal vault. Symptoms of constipation should be
excluded clinically.

Tech Notes:

PT STATES HAS ABD PAIN, DISCOMFORT. HX OF GASTRIC BYPASS AND FEEDING TUBE. GFR 57. 50ML [id number].
CT/NM 2/0. TJ

## 2023-06-23 ENCOUNTER — Encounter: Admit: 2023-06-23 | Discharge: 2023-06-23

## 2023-06-23 NOTE — Telephone Encounter
Appointment question:                                      Are you calling to cancel an upcoming appointment? Yes  10/08  What is the reason for cancelling the appointment? Family emergency   Do you want to reschedule the appointment? Yes   What is the best number to reach you? Cell

## 2023-06-23 NOTE — Telephone Encounter
Spoke to patient rescheduled appt 10/16@10  with Cherie

## 2023-07-01 ENCOUNTER — Ambulatory Visit: Admit: 2023-07-01 | Discharge: 2023-07-01

## 2023-07-01 ENCOUNTER — Ambulatory Visit: Admit: 2023-07-01 | Discharge: 2023-07-01 | Payer: Medicaid Other

## 2023-07-01 ENCOUNTER — Encounter: Admit: 2023-07-01 | Discharge: 2023-07-01

## 2023-07-01 DIAGNOSIS — E559 Vitamin D deficiency, unspecified: Secondary | ICD-10-CM

## 2023-07-01 DIAGNOSIS — K7460 Unspecified cirrhosis of liver: Secondary | ICD-10-CM

## 2023-07-01 DIAGNOSIS — Z0000 Encounter for general adult medical examination without abnormal findings: Secondary | ICD-10-CM

## 2023-07-01 DIAGNOSIS — M1990 Unspecified osteoarthritis, unspecified site: Secondary | ICD-10-CM

## 2023-07-01 DIAGNOSIS — M810 Age-related osteoporosis without current pathological fracture: Secondary | ICD-10-CM

## 2023-07-01 DIAGNOSIS — R7989 Other specified abnormal findings of blood chemistry: Secondary | ICD-10-CM

## 2023-07-01 DIAGNOSIS — Z9889 Other specified postprocedural states: Secondary | ICD-10-CM

## 2023-07-01 DIAGNOSIS — E8809 Other disorders of plasma-protein metabolism, not elsewhere classified: Secondary | ICD-10-CM

## 2023-07-01 DIAGNOSIS — E509 Vitamin A deficiency, unspecified: Secondary | ICD-10-CM

## 2023-07-01 DIAGNOSIS — K760 Fatty (change of) liver, not elsewhere classified: Secondary | ICD-10-CM

## 2023-07-01 DIAGNOSIS — K7682 Hepatic encephalopathy syndrome (HCC): Secondary | ICD-10-CM

## 2023-07-01 DIAGNOSIS — K909 Intestinal malabsorption, unspecified: Secondary | ICD-10-CM

## 2023-07-01 DIAGNOSIS — E43 Unspecified severe protein-calorie malnutrition: Secondary | ICD-10-CM

## 2023-07-01 DIAGNOSIS — H409 Unspecified glaucoma: Secondary | ICD-10-CM

## 2023-07-01 DIAGNOSIS — K7210 Chronic hepatic failure without coma: Secondary | ICD-10-CM

## 2023-07-01 DIAGNOSIS — D509 Iron deficiency anemia, unspecified: Secondary | ICD-10-CM

## 2023-07-01 DIAGNOSIS — E669 Obesity, unspecified: Secondary | ICD-10-CM

## 2023-07-01 LAB — ALPHA FETO PROTEIN (AFP), NON-PREGNANT: AFP: 2.8 ng/mL — ABNORMAL LOW (ref 0.0–15.0)

## 2023-07-01 LAB — COMPREHENSIVE METABOLIC PANEL
ALBUMIN: 4.4 g/dL (ref 3.5–5.0)
ALK PHOSPHATASE: 248 U/L — ABNORMAL HIGH (ref 25–110)
ALT: 11 U/L — ABNORMAL LOW (ref 7–56)
ANION GAP: 10 10*3/uL (ref 3–12)
AST: 10 U/L (ref 7–40)
CO2: 22 MMOL/L (ref 21–30)
CREATININE: 1.4 mg/dL — ABNORMAL HIGH (ref 0.4–1.00)
POTASSIUM: 4.4 MMOL/L — ABNORMAL LOW (ref 3.5–5.1)
SODIUM: 143 MMOL/L — ABNORMAL LOW (ref 137–147)
TOTAL BILIRUBIN: 0.5 mg/dL (ref 0.2–1.3)
TOTAL PROTEIN: 7.4 g/dL — ABNORMAL HIGH (ref 6.0–8.0)

## 2023-07-01 LAB — CBC AND DIFF
RBC COUNT: 3.5 M/UL — ABNORMAL LOW (ref 4.0–5.0)
WBC COUNT: 3.8 10*3/uL — ABNORMAL LOW (ref 4.5–11.0)

## 2023-07-01 LAB — PROTIME INR (PT)
INR: 1.1 mg/dL (ref 0.9–1.2)
PROTIME: 12 s — ABNORMAL HIGH (ref 10.2–12.9)

## 2023-07-01 LAB — 25-OH VITAMIN D (D2 + D3): VITAMIN D (25-OH) TOTAL: 29 ng/mL — ABNORMAL LOW (ref 30–80)

## 2024-01-04 ENCOUNTER — Encounter: Admit: 2024-01-04 | Discharge: 2024-01-04 | Payer: MEDICAID

## 2024-01-04 ENCOUNTER — Ambulatory Visit: Admit: 2024-01-04 | Discharge: 2024-01-05 | Payer: MEDICAID

## 2024-01-04 DIAGNOSIS — K760 Fatty (change of) liver, not elsewhere classified: Secondary | ICD-10-CM

## 2024-01-04 DIAGNOSIS — K766 Portal hypertension: Secondary | ICD-10-CM

## 2024-01-04 DIAGNOSIS — Z0000 Encounter for general adult medical examination without abnormal findings: Secondary | ICD-10-CM

## 2024-01-04 DIAGNOSIS — E559 Vitamin D deficiency, unspecified: Secondary | ICD-10-CM

## 2024-01-04 DIAGNOSIS — K7460 Unspecified cirrhosis of liver: Secondary | ICD-10-CM

## 2024-01-04 DIAGNOSIS — E43 Unspecified severe protein-calorie malnutrition: Secondary | ICD-10-CM

## 2024-01-04 DIAGNOSIS — K432 Incisional hernia without obstruction or gangrene: Secondary | ICD-10-CM

## 2024-01-04 NOTE — Progress Notes
 Date of Service: 01/04/2024     Subjective:             Katie Rivera is a 58 y.o. female.    History of Present Illness:  [name] is a 58 year old female who presents today for a 6 month follow up.  As you know, 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  She does have evidence of decompensated liver disease with a history of ascites/edema and HE, although now appearing to be re-compensated.  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    Interval history  Since last being seen, [name] reports doing quite well in regards to her liver history.  She reports no clinical changes.  She reports no changes in her overall health.  She does admit she has not yet completed updated abdominal imaging as she has had troubles scheduling it.  She reports no hospitalizations or ER visits.    In reviewing [name] liver disease, we note she does have a history of decompensation with ascites.  It is reported she had some HE in the past as well, however suspect it was secondary to her severe malnutrition (?).  When reviewing symptoms, she now reports no symptoms concerning for HE.  She continues to deny any ascites, edema, jaundice, pruritus or episodes of GIB.  Today, [name] reports no abdominal pain, nausea, vomiting, hematemesis, diarrhea, melena, hematochezia, fevers, chills, fatigue, or unexpected weight loss.      In reviewing her comorbiditities, we find she does continue to follow closely with her PCP and other specialists for management.  Today, she reports no chest pain, palpitations or SOB.    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  She presents alone today.    Health Maintenance:  Colonoscopy-  never  EGD-  August 2023  Influenza-  2024  COVID- 2024         Review of Systems  A complete 10 point review of systems was negative except those listed in the HPI.    Objective:           Your Current Medications:         Instructions    acetaminophen (TYLENOL) 325 mg tablet Take two tablets by mouth every 4 hours as needed.    ascorbic acid (VITAMIN C PO) Take  by mouth.    cholecalciferol (VITAMIN D-3) 5000 unit tablet Take one tablet by mouth daily.    cyanocobalamin (VITAMIN B-12, RUBRAMIN) 1,000 mcg/mL injection Inject 1 mL into the muscle every 30 days.    lipase-protease-amylase (CREON 24000) 24000-76000-120000 units capsule Take two capsules by mouth daily. Take 1 capsule with snack.    omeprazole DR (PRILOSEC) 20 mg capsule Take one capsule by mouth twice daily.    promethazine (PHENERGAN) 25 mg tablet Take one tablet by mouth every 6 hours as needed for Nausea or Vomiting.          There were no vitals filed for this visit.    There  is no height or weight on file to calculate BMI.    Physical Exam  *PE limited as visit completed via TH    Constitutional: Patient appears energized, developed, nourished, in no apparent distress.  Responds appropriately to questions.  Neuro:  AOx4  HEENT: No scleral icterus noted.    Skin:  No jaundice  Psychiatric: Normal mood and affect. Behavior is normal. Judgment and thought content normal.    Labs:  Labs reviewed in O2 and CE    MELD 3.0: 12 at 07/01/2023 10:50 AM  MELD-Na: 11 at 07/01/2023 10:50 AM  Calculated from:  Serum Creatinine: 1.42 MG/DL at 98/07/9146 82:95 AM  Serum Sodium: 143 MMOL/L (Using max of 137 MMOL/L) at 07/01/2023 10:50 AM  Total Bilirubin: 0.5 MG/DL (Using min of 1 MG/DL) at 62/13/0865 78:46 AM  Serum Albumin : 4.4 G/DL (Using max of 3.5 G/DL) at 96/29/5284 13:24 AM  INR(ratio): 1.1 at 07/01/2023 10:50 AM  Age at listing (hypothetical): 57 years  Sex: Female at 07/01/2023 10:50 AM    Diagnostic tests:    Liver biopsy (April 8 ,2019):  Cirrhosis (Stage 4/4) with steatohepatitis.     Comment:   The histopathological changes are most consistent with active   steatohepatitis, although other causes of liver injury should be   clinically and serologically excluded.  Steatohepatitis is a nonspecific   pattern of injury seen most commonly with morbid obesity, diabetes,   insulin resistance, and alcohol abuse.  However, it can also be associated   with nutritional causes, metabolic disorders or the result of certain   medications.     ACTIVITY SCORE (NAS):   Steatosis:   1 (5-33%);       Lobular Inflammation:   1 (< 67foci/200x);     Hepatocyte Ballooning:   1 (few ballooned hepatocytes);     Total: 3/8     FIBROSIS:   4 Cirrhosis     PAS stain shows normal glycogen content and PAS/diastase is negative for   unusual intrahepatocyte inclusions.  Trichrome stain is used to assess the   fibrosis.  Iron  stain is negative for increased hepatic storage iron .   Reticulin stain shows intact hepatic plate architecture.      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 is known to have developed cirrhosis secondary to [facility].  She is known to have evidence of decompensation including ascites/edema and reported HE in the past; re-compensated at this time  - When reviewing the management of fatty liver disease, it was again discussed the importance of an appropriate diet and exercise plan, in addition to managing and screening for comorbidities.   - Diabetes screening and management is critical with ideal goal Hgb A1c goal of =/< 7%. If possible addition of insulin sensitizing agents such as metformin may be helpful if indicated.  Alternatively, reviewed no contraindication to the use of GLP1 agonists  - We encourage to be aggressive with hyperlipidemia and elevated triglycerides management as well, and proceed with statins if indicated as they are safe in patients  with fatty liver disease.  - We encourage liberalizing coffee and black tea consumption as some data has shown this may slow progression and reverse effects of MASH-related fibrosis  - As for transplant candidacy, we again discussed the significance of the MELD score.  Previous MELD scores have been low.  With a low MELD, liver transplant evaluation not yet indicated.  We will repeat MELD labs.  Orders sent to preferred lab  - Additionally, compliance is needed.  We will monitor this with each visit.  - For now, we will continue to manage complications related to her liver disease accordingly.     - Reminded her that with the presence of cirrhosis, she is avoid all alcohol use.  All herbal supplements are to be avoided as well.    Liver transplant candidacy:  - We reviewed in depth the theory and process of liver transplantation in the [place]  and the specifics of liver transplantation here at the [facility].  We again discussed the MELD score, the transplant evaluation process, and the transplant waitlist.  - As reviewed above, with a low MELD, evaluation not yet indicated.    HCC Screening:  - Secondary to cirrhosis, she is at risk for developing HCC.  As a result, we will proceed with HCC screening.  - Recommend screening for [facility] with either abdominal ultrasound or alternating abdominal ultrasound with a triple phase CT of the abdomen with IV contrast OR MRI of the abdomen with IV contrast every 6 months.  - Abdominal US  recommended now.  This can be done locally and she has made attempts to arrange.  She will try to call again today.  - AFP needed every 6 months to be done at time of imaging screen.  We will plan to repeat AFP with labs today.  - Reminded Mrs.Katie Rivera that should she develop any new onset, unexpected weight loss, she is to notify the office for review.    Hepatic encephalopathy:   - Today, she presents with no signs of HE.  No interventions needed for now.  - Educated her and her mother to be mindful for any signs of confusion/fogginess in thinking, worsening malaise, or tremors that may suggest the development of HE.  Should this occur, she is to notify the office for review.    Portal HTN with risk for esophageal varices:  - EGD was reported to have been done shortly after her gastric surgery ~2016.  A more updated EGD was completed in August 2023 with no varices.  Will repeat in 3 years, or sooner if clinically indicated  - With her future EGDs, if there is any evidence of medium/large esophageal varices, we would recommend esophageal varix band ligation.  If band ligation is performed, please continue to repeat (~ every 4-6 weeks) until eradication of varices.  - Reminded Mrs.Katie Rivera that should she have any episodes of hematemesis or melena, she should proceed to the ER immediately.    Ascites/Edema:   - Now reports with no ascites, however some mild edema, currently off all diuretics.  No adjustments needed  - Should she have a return of ascites, she is to notify the office for review  - Encouraged her to continue with a low sodium diet <2 grams/day, with high amounts of lean protein (see below for details).    Protein Malnutrition:  - For patients with cirrhosis, it is very important to eat the right types and amounts of foods.  Given her complications post gastric  bypass, we see her overall nutritional status has been quite poor, now much improved.  She has been following with endocrinology for management and will continue to follow with them as we follow along.  - Reviewed that bedtime snacks are especially important (preferably something with some protein, if tolerated; she reports diarrhea with any protein supplement).  - Typically patients with malnutrition and/or loss of muscular mass can improve their nutrition and muscular mass by drinking two cans of dietary supplements daily, particularly at bedtime.  These would include: Ensure, Boost, Carnation Instant Milk, Glucerna, (or similar supplements).  Given her gastric bypass, her ability to use these supplements are limited.  Reviewed other sources of protein she can consider.  - Please avoid eating raw seafood, especially shellfish, because of risk of serious illness    Bone Health/Vitamin D  deficiency  - Secondary to cirrhosis and her nutritional deficits, Mrs.Katie Rivera is at risk for Vitamin D  deficiency and developing osteopenia/osteoporosis.  We will continue to monitor for changes.  - Vitamin D  with labs, interventions to follow  - BMD should be obtained now for screening.  Calcium  supplementation encouraged with any evidence of osteopenia/osteoporosis.  If indicated, consideration of bisphosphonate therapy may be needed as well.  She will complete this with her PCP locally    Hernia:  - She does have a hernia (reducible) and has considered surgical repair in the past, however deferred.  - Will obtain labs today and review liver function and risk stratification for an elective hernia repair.  If she decides to proceed, would recommend it be completed at [place] with HPB surgery.  Referral can be placed once she is ready.  She states she will update us  if/when she is ready  - ER precautions reviewed     Health maintenance  - We recommend immunization for hepatitis B if not completed since April 2019, this can be done locally as well.  She is immune to hepatitis A.  - Encourage yearly influenza vaccine  - She has not yet had a colonoscopy and is due for one for CRC screening.  She is considering it for now and may proceed with a cologuard as she has discussed with her PCP  - Encouraged her to continue with annual mammograms for surveillance  - Again, encourage all patients with cirrhosis should avoid the use of Non-steroidal Anti-Inflammatory (NSAID) medications as they can cause significant injury to the kidneys in this population.  If needed, you may use Tylenol  PRN, no more than 2g/day.  - Reminded Mrs.Severns that should she have any hospitalizations or ER visits, she is to notify the office as well.  - Discussed that if any elective surgeries are needed, we would recommend first reviewing with the clinic first, given her liver disease.    - COVID precautions reviewed and encouraged.  She is up-to-date on current vaccine/booster recommendations.       Follow-up:  She is to return to Liver Clinic in 6 months.  As always, she may come sooner if a problem arises.               I am grateful for the opportunity to participate in the care of this patient.  If you have any further questions, please don't hesitate to contact our office.      _____________________________  Benjamin Brands, APRN-C  Hepatology & Liver Transplantation  [place]  [facility]  [phone]    Total of 35 minutes were spent on the same day of the visit  including preparing to see the patient, obtaining and reviewing separately obtained history, performing a medically appropriate examination and evaluation, counseling and educating the patient of the side effect profile of cirrhosis/pathophysiology of the underlying disease process and subsequent development of portal HTN and its sequale, role of a liver transplant and current barriers (low MELD, need for compliance), ordering medications, tests, or procedures for ongoing management, referring and communication with other health care professionals, documenting clinical information in the electronic or other health record, interpreting results of labs and procedures ordered and communicating results to the patient, and care coordination.

## 2024-01-07 ENCOUNTER — Encounter: Admit: 2024-01-07 | Discharge: 2024-01-07 | Payer: MEDICAID

## 2024-01-07 DIAGNOSIS — K766 Portal hypertension: Secondary | ICD-10-CM

## 2024-01-07 DIAGNOSIS — K7460 Unspecified cirrhosis of liver: Secondary | ICD-10-CM

## 2024-01-07 DIAGNOSIS — Z0000 Encounter for general adult medical examination without abnormal findings: Secondary | ICD-10-CM

## 2024-01-07 DIAGNOSIS — K760 Fatty (change of) liver, not elsewhere classified: Secondary | ICD-10-CM

## 2024-01-08 ENCOUNTER — Encounter: Admit: 2024-01-08 | Discharge: 2024-01-08 | Payer: MEDICAID

## 2024-01-11 ENCOUNTER — Encounter: Admit: 2024-01-11 | Discharge: 2024-01-11 | Payer: MEDICAID

## 2024-01-13 ENCOUNTER — Encounter: Admit: 2024-01-13 | Discharge: 2024-01-13 | Payer: MEDICAID

## 2024-06-21 ENCOUNTER — Encounter: Admit: 2024-06-21 | Discharge: 2024-06-21 | Payer: MEDICAID

## 2024-06-21 DIAGNOSIS — K7460 Unspecified cirrhosis of liver: Principal | ICD-10-CM

## 2024-06-22 ENCOUNTER — Encounter: Admit: 2024-06-22 | Discharge: 2024-06-22 | Payer: MEDICAID

## 2024-06-22 DIAGNOSIS — K7469 Other cirrhosis of liver: Principal | ICD-10-CM

## 2024-07-06 ENCOUNTER — Encounter: Admit: 2024-07-06 | Discharge: 2024-07-06 | Payer: MEDICAID

## 2024-07-06 NOTE — Progress Notes
 Fax U/S abd orders due April 2026 to [facility] via fax @ [phone]. Facility will contact pt to schedule directly. Requested return fax with date and time to [phone].

## 2024-07-10 NOTE — Progress Notes [1]
 Date of Service: 07/11/2024       Katie Rivera is a 58 y.o. female.    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  She is known to have evidence of decompensation including ascites/edema and reported HE in the past; re-compensated at this time  - When reviewing the management of fatty liver disease, it was again discussed the importance of an appropriate diet and exercise plan, in addition to managing and screening for comorbidities.   - Diabetes screening and management is critical with ideal goal Hgb A1c goal of =/< 7%. If possible addition of insulin sensitizing agents such as metformin may be helpful if indicated.  Alternatively, reviewed no contraindication to the use of GLP1 agonists, if indicated  - We encourage to be aggressive with hyperlipidemia and elevated triglycerides management as well, and proceed with statins if indicated as they are safe in patients with fatty liver disease.  - We encourage liberalizing coffee and black tea consumption as some data has shown this may slow progression and reverse effects of MASH-related fibrosis  - As for transplant candidacy, we again discussed the significance of the MELD score.  Previous MELD scores have been low.  With a low MELD, liver transplant evaluation not yet indicated.  We will repeat MELD labs now.  Orders sent to preferred lab.  - Additionally, compliance is needed.  We will monitor this with each visit.  - For now, we will continue to manage complications related to her liver disease accordingly.     - Reminded her that with the presence of cirrhosis, she is avoid all alcohol use.  All herbal supplements are to be avoided as well.    Liver transplant candidacy:  - We reviewed in depth the theory and process of liver transplantation in the [place]  and the specifics of liver transplantation here at the [facility].  We again discussed the MELD score, the transplant evaluation process, and the transplant waitlist.  - As reviewed above, with a low MELD, evaluation not yet indicated.    HCC Screening:  - Secondary to cirrhosis, she is at risk for developing HCC.  As a result, we will proceed with HCC screening.  - Recommend screening for [facility] with either abdominal ultrasound or alternating abdominal ultrasound with a triple phase CT of the abdomen with IV contrast OR MRI of the abdomen with IV contrast every 6 months.  - Abdominal US  updated in October 2025 with no concerning lesions.  Will plan to repeat in 6 months  - AFP needed every 6 months to be done at time of imaging screen.  We will plan to repeat AFP with labs.  - Reminded Katie Rivera that should she develop any new onset, unexpected weight loss, she is to notify the office for review.    Hepatic encephalopathy:   - Today, she presents with no signs of HE.  No interventions needed for now.  - Educated her and her mother to be mindful for any signs of confusion/fogginess in thinking, worsening malaise, or tremors that may suggest the development of HE.  Should this occur, she is to notify the office for review.    Portal HTN with risk for esophageal varices:  - EGD was reported to have been done shortly after her gastric surgery ~2016.  A more updated EGD was completed in August 2023 with no  varices.  Will repeat in 3 years, or sooner if clinically indicated  - With her future EGDs, if there is any evidence of medium/large esophageal varices, we would recommend esophageal varix band ligation.  If band ligation is performed, please continue to repeat (~ every 4-6 weeks) until eradication of varices.  - Reminded Katie Rivera that should she have any episodes of hematemesis or melena, she should proceed to the ER immediately.    Ascites/Edema:   - Now reports no ascites, however or changes in edema, currently off all diuretics.  No adjustments needed  - Should she have a return of ascites, she is to notify the office for review  - Encouraged her to continue with a low sodium diet <2 grams/day, with high amounts of lean protein (see below for details).    Protein Malnutrition:  - For patients with cirrhosis, it is very important to eat the right types and amounts of foods.  Given her complications post gastric bypass, we see her overall nutritional status has been quite poor, now much improved.  She has been following with endocrinology for management and will continue to follow with them as we follow along.  - Reviewed that bedtime snacks are especially important (preferably something with some protein, if tolerated; she reports diarrhea with any protein supplement).  - Typically patients with malnutrition and/or loss of muscular mass can improve their nutrition and muscular mass by drinking two cans of dietary supplements daily, particularly at bedtime.  These would include: Ensure, Boost, Carnation Instant Milk, Glucerna, (or similar supplements).  Given her gastric bypass, her ability to use these supplements are limited.  Reviewed other sources of protein she can consider.  - Please avoid eating raw seafood, especially shellfish, because of risk of serious illness    Bone Health/Vitamin D  deficiency  - Secondary to cirrhosis and her nutritional deficits, Katie Rivera is at risk for Vitamin D  deficiency and developing osteopenia/osteoporosis.  We will continue to monitor for changes.  - Vitamin D  with labs, interventions to follow  - BMD should be obtained now for screening.  Calcium  supplementation encouraged with any evidence of osteopenia/osteoporosis.  If indicated, consideration of bisphosphonate therapy may be needed as well.  She can complete this with her PCP locally    Hernia:  - She does have a hernia (reducible) and has considered surgical repair in the past, however deferred.  - Will obtain labs today and review liver function and risk stratification for an elective hernia repair.  If she decides to proceed, would recommend it be completed at [place] with HPB surgery.  Referral can be placed once she is ready.  She states she will update us  if/when she is ready  - ER precautions reviewed     Dental decay/need for extractions:  - She reports plans for extraction of all of her lower teeth, locally.  We will obtain labs to ensure no changes in liver function.  She states the procedure will be done with local anesthesia/conscious sedation (?).  - We did review bleeding risks related to her liver disease and will monitor for changes post-operatively  Some recommendations we have include  *Avoid sedating agents as able.  Benzodiazepines particularly should be avoided.    * If narcotics are used post-operatively use sparingly as they may contribute to worsening hepatic encephalopathy    Health maintenance:  - We recommend immunization for hepatitis B if not completed since April 2019, this can be done locally as well.  She is immune to  hepatitis A.  - Encourage yearly influenza vaccine  - She has not yet had a colonoscopy and is due for one for CRC screening.  She is considering it for now and may proceed with a cologuard as she has discussed with her PCP  - Encouraged her to continue with annual mammograms for surveillance  - Again, encourage all patients with cirrhosis should avoid the use of Non-steroidal Anti-Inflammatory (NSAID) medications as they can cause significant injury to the kidneys in this population.  If needed, you may use Tylenol  PRN, no more than 2g/day.  - Reminded Katie Rivera that should she have any hospitalizations or ER visits, she is to notify the office as well.  - Discussed that if any elective surgeries are needed, we would recommend first reviewing with the clinic first, given her liver disease.    - COVID precautions reviewed and encouraged.  She is up-to-date on current vaccine/booster recommendations.       Follow-up:  She is to return to Liver Clinic in 6 months. As always, she may come sooner if a problem arises.   ===================================================================================================  Subjective:    History of Present Illness:  Katie Rivera is a 58 year old female who presents today for a 6 month follow up.  As you know, Katie Rivera follows with our clinic for management of her cirrhosis, secondary to [facility] (previously referred to as NASH), biopsy proven.  She does have evidence of decompensated liver disease with a history of ascites/edema and HE, although now appearing to be re-compensated.  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    Interval history  Since last being seen, Katie Rivera reports doing quite well in regards to her liver history.  She reports the only change has been related to her dental issues.  She is now scheduled to have all her lower teeth removed in the end of November.  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  She reports no other general changes in her health.  She reports no specific questions or concerns with her liver disease.  She reports no hospitalizations or ER visits.    In reviewing Katie Rivera [facility] liver disease, we note she does have a history of decompensation with ascites.  It is reported she had some HE in the past as well, however suspect it was secondary to her severe malnutrition at the time(?).  When reviewing symptoms, she now reports no symptoms concerning for HE.  She continues to deny any ascites, edema, jaundice, pruritus or episodes of GIB.  Today, Katie Rivera reports no abdominal pain, nausea, vomiting, hematemesis, diarrhea, melena, hematochezia, fevers, chills, fatigue, or unexpected weight loss.      In reviewing her comorbiditities, we find she does continue to follow closely with her PCP and other specialists for management.  Today, she reports no chest pain, palpitations or SOB.    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  She presents alone today.    Health Maintenance:  Colonoscopy-  never  EGD-  August 2023  Influenza-  2024  COVID- 2024       Review of Systems  A complete 10 point review of systems was negative except those listed in the HPI.    Objective:         Your Current Medications:         Instructions    acetaminophen  (TYLENOL ) 325 mg tablet Take two tablets by mouth every 4 hours as needed.    ascorbic acid  (VITAMIN C  PO) Take  by mouth.    calcium  citrate/vitamin D3 (CALCIUM  CITRATE + D PO) Take 1 tablet by mouth daily.    cholecalciferol  (VITAMIN D -3) 5000 unit tablet Take one tablet by mouth daily.    cyanocobalamin (VITAMIN B-12, RUBRAMIN) 1,000 mcg/mL injection Inject 1 mL into the muscle every 30 days.    latanoprost (XALATAN) 0.005 % ophthalmic solution Apply one drop to both eyes at bedtime daily.    lipase -protease -amylase  (CREON  24000) 24000-76000-120000 units capsule Take two capsules by mouth daily. Take 1 capsule with snack.    omeprazole DR (PRILOSEC) 20 mg capsule Take one capsule by mouth twice daily.    promethazine  (PHENERGAN ) 25 mg tablet Take one tablet by mouth every 6 hours as needed for Nausea or Vomiting.          There were no vitals filed for this visit.  There is no height or weight on file to calculate BMI.    Physical Exam  *PE limited as visit completed via TH    Constitutional: Patient appears energized, developed, nourished, in no apparent distress.  Responds appropriately to questions.  Neuro:  AOx4  HEENT: No scleral icterus noted.    Skin:  No jaundice  Psychiatric: Normal mood and affect. Behavior is normal. Judgment and thought content normal.    Labs:  Labs reviewed in O2 and CE    MELD 3.0: 12 at 01/05/2024 10:11 AM  MELD-Na: 11 at 01/05/2024 10:11 AM  Calculated from:  Serum Creatinine: 1.42 mg/dL at 5/77/7974 89:88 AM  Serum Sodium: 140 mmol/L (Using max of 137 mmol/L) at 01/05/2024 10:11 AM  Total Bilirubin: 0.57 mg/dL (Using min of 1 mg/dL) at 5/77/7974 89:88 AM  Serum Albumin : 4 g/dL (Using max of 3.5 g/dL) at 5/77/7974 89:88 AM  INR(ratio): 1.1 at 01/05/2024 10:11 AM  Age at listing (hypothetical): 57 years  Sex: Female at 01/05/2024 10:11 AM    Diagnostic tests:    Liver biopsy (April 8 ,2019):  Cirrhosis (Stage 4/4) with steatohepatitis.     Comment:   The histopathological changes are most consistent with active   steatohepatitis, although other causes of liver injury should be   clinically and serologically excluded.  Steatohepatitis is a nonspecific   pattern of injury seen most commonly with morbid obesity, diabetes,   insulin resistance, and alcohol abuse.  However, it can also be associated   with nutritional causes, metabolic disorders or the result of certain   medications.     ACTIVITY SCORE (NAS):   Steatosis:   1 (5-33%);       Lobular Inflammation:   1 (< 36foci/200x);     Hepatocyte Ballooning:   1 (few ballooned hepatocytes);     Total: 3/8     FIBROSIS:   4 Cirrhosis     PAS stain shows normal glycogen content and PAS/diastase is negative for   unusual intrahepatocyte inclusions.  Trichrome stain is used to assess the   fibrosis.  Iron  stain is negative for increased hepatic storage iron .   Reticulin stain shows intact  hepatic plate architecture.      Indirect portal pressure measurements (December 21, 2017):    The corrected sinusoidal pressure of 6 mmHg which is compatible with mild portal hypertension    EGD (August 2023):  No reported varices    Colonoscopy:  Never    Imaging:    Abdominal US  (October 2025)  No reported liver lesions    *Full report reviewed in outside records/O2              I am grateful for the opportunity to participate in the care of this patient.  If you have any further questions, please don't hesitate to contact our office.      _____________________________  Jenne Fellers, APRN-C  Hepatology & Liver Transplantation  [facility]  [phone]    Total of 35 minutes were spent on the same day of the visit including preparing to see the patient, obtaining and reviewing separately obtained history, performing a medically appropriate examination and evaluation, counseling and educating the patient of the side effect profile of cirrhosis/pathophysiology of the underlying disease process and subsequent development of portal HTN and its sequale, role of a liver transplant and current barriers (low MELD), ordering medications, tests, or procedures for ongoing management, referring and communication with other health care professionals, documenting clinical information in the electronic or other health record, interpreting results of labs and procedures ordered and communicating results to the patient, and care coordination.

## 2024-07-11 ENCOUNTER — Ambulatory Visit: Admit: 2024-07-11 | Discharge: 2024-07-12 | Payer: MEDICAID

## 2024-07-11 ENCOUNTER — Encounter: Admit: 2024-07-11 | Discharge: 2024-07-11 | Payer: MEDICAID

## 2024-07-11 DIAGNOSIS — Z0000 Encounter for general adult medical examination without abnormal findings: Secondary | ICD-10-CM

## 2024-07-11 DIAGNOSIS — K7460 Unspecified cirrhosis of liver: Principal | ICD-10-CM

## 2024-07-11 DIAGNOSIS — K766 Portal hypertension: Secondary | ICD-10-CM

## 2024-07-11 DIAGNOSIS — K760 Fatty (change of) liver, not elsewhere classified: Secondary | ICD-10-CM

## 2024-07-11 DIAGNOSIS — E559 Vitamin D deficiency, unspecified: Secondary | ICD-10-CM

## 2024-07-13 ENCOUNTER — Encounter: Admit: 2024-07-13 | Discharge: 2024-07-13 | Payer: MEDICAID

## 2024-07-14 ENCOUNTER — Encounter: Admit: 2024-07-14 | Discharge: 2024-07-14 | Payer: MEDICAID

## 2024-07-18 ENCOUNTER — Encounter: Admit: 2024-07-18 | Discharge: 2024-07-18 | Payer: MEDICAID

## 2024-07-19 ENCOUNTER — Encounter: Admit: 2024-07-19 | Discharge: 2024-07-19 | Payer: MEDICAID
